# Patient Record
Sex: Male | Born: 1960 | Hispanic: No | Marital: Married | Smoking: Never smoker
Health system: Southern US, Community
[De-identification: ages and names within clinical notes are randomized; demographics above are authoritative.]

## PROBLEM LIST (undated history)

## (undated) DIAGNOSIS — J4530 Mild persistent asthma, uncomplicated: Secondary | ICD-10-CM

## (undated) DIAGNOSIS — N189 Chronic kidney disease, unspecified: Secondary | ICD-10-CM

## (undated) DIAGNOSIS — I10 Essential (primary) hypertension: Secondary | ICD-10-CM

## (undated) DIAGNOSIS — R21 Rash and other nonspecific skin eruption: Secondary | ICD-10-CM

## (undated) DIAGNOSIS — E119 Type 2 diabetes mellitus without complications: Secondary | ICD-10-CM

## (undated) DIAGNOSIS — E782 Mixed hyperlipidemia: Secondary | ICD-10-CM

## (undated) DIAGNOSIS — G4733 Obstructive sleep apnea (adult) (pediatric): Secondary | ICD-10-CM

## (undated) DIAGNOSIS — R7301 Impaired fasting glucose: Secondary | ICD-10-CM

## (undated) DIAGNOSIS — R0683 Snoring: Secondary | ICD-10-CM

## (undated) HISTORY — DX: Mixed hyperlipidemia: E78.2

## (undated) HISTORY — DX: Mild persistent asthma, uncomplicated: J45.30

## (undated) HISTORY — PX: COLONOSCOPY: SHX174

## (undated) HISTORY — DX: Essential (primary) hypertension: I10

## (undated) HISTORY — DX: Obstructive sleep apnea (adult) (pediatric): G47.33

## (undated) HISTORY — DX: Impaired fasting glucose: R73.01

## (undated) HISTORY — DX: Snoring: R06.83

## (undated) HISTORY — DX: Rash and other nonspecific skin eruption: R21

---

## 2002-04-24 ENCOUNTER — Encounter: Payer: Self-pay | Admitting: Emergency Medicine

## 2002-04-24 ENCOUNTER — Emergency Department (HOSPITAL_COMMUNITY): Admission: EM | Admit: 2002-04-24 | Discharge: 2002-04-24 | Payer: Self-pay | Admitting: Emergency Medicine

## 2003-04-18 ENCOUNTER — Emergency Department (HOSPITAL_COMMUNITY): Admission: EM | Admit: 2003-04-18 | Discharge: 2003-04-18 | Payer: Self-pay | Admitting: Emergency Medicine

## 2005-06-26 ENCOUNTER — Emergency Department (HOSPITAL_COMMUNITY): Admission: EM | Admit: 2005-06-26 | Discharge: 2005-06-26 | Payer: Self-pay | Admitting: Emergency Medicine

## 2010-04-26 ENCOUNTER — Emergency Department (HOSPITAL_COMMUNITY)
Admission: EM | Admit: 2010-04-26 | Discharge: 2010-04-26 | Disposition: A | Discharge status: Home / Self Care | Payer: BC Managed Care – PPO | Attending: Emergency Medicine | Admitting: Emergency Medicine

## 2010-04-26 ENCOUNTER — Emergency Department (HOSPITAL_COMMUNITY): Payer: BC Managed Care – PPO

## 2010-04-26 DIAGNOSIS — J45909 Unspecified asthma, uncomplicated: Secondary | ICD-10-CM | POA: Insufficient documentation

## 2010-04-26 DIAGNOSIS — I10 Essential (primary) hypertension: Secondary | ICD-10-CM | POA: Insufficient documentation

## 2010-04-26 DIAGNOSIS — R42 Dizziness and giddiness: Secondary | ICD-10-CM | POA: Insufficient documentation

## 2010-04-26 DIAGNOSIS — S7000XA Contusion of unspecified hip, initial encounter: Secondary | ICD-10-CM | POA: Insufficient documentation

## 2010-04-26 DIAGNOSIS — R296 Repeated falls: Secondary | ICD-10-CM | POA: Insufficient documentation

## 2010-04-26 DIAGNOSIS — R55 Syncope and collapse: Secondary | ICD-10-CM | POA: Insufficient documentation

## 2010-04-26 DIAGNOSIS — Y92009 Unspecified place in unspecified non-institutional (private) residence as the place of occurrence of the external cause: Secondary | ICD-10-CM | POA: Insufficient documentation

## 2010-04-26 DIAGNOSIS — E785 Hyperlipidemia, unspecified: Secondary | ICD-10-CM | POA: Insufficient documentation

## 2010-04-27 LAB — POCT I-STAT, CHEM 8
BUN: 19 mg/dL (ref 6–23)
Calcium, Ion: 1.15 mmol/L (ref 1.12–1.32)
Chloride: 102 mEq/L (ref 96–112)
Creatinine, Ser: 1.4 mg/dL (ref 0.4–1.5)
Sodium: 140 mEq/L (ref 135–145)

## 2010-08-19 ENCOUNTER — Emergency Department (HOSPITAL_COMMUNITY): Payer: BC Managed Care – PPO

## 2010-08-19 ENCOUNTER — Emergency Department (HOSPITAL_COMMUNITY)
Admission: EM | Admit: 2010-08-19 | Discharge: 2010-08-19 | Disposition: A | Discharge status: Home / Self Care | Payer: BC Managed Care – PPO | Attending: Emergency Medicine | Admitting: Emergency Medicine

## 2010-08-19 DIAGNOSIS — R509 Fever, unspecified: Secondary | ICD-10-CM | POA: Insufficient documentation

## 2010-08-19 DIAGNOSIS — Z79899 Other long term (current) drug therapy: Secondary | ICD-10-CM | POA: Insufficient documentation

## 2010-08-19 DIAGNOSIS — R0602 Shortness of breath: Secondary | ICD-10-CM | POA: Insufficient documentation

## 2010-08-19 DIAGNOSIS — E785 Hyperlipidemia, unspecified: Secondary | ICD-10-CM | POA: Insufficient documentation

## 2010-08-19 DIAGNOSIS — E876 Hypokalemia: Secondary | ICD-10-CM | POA: Insufficient documentation

## 2010-08-19 DIAGNOSIS — IMO0001 Reserved for inherently not codable concepts without codable children: Secondary | ICD-10-CM | POA: Insufficient documentation

## 2010-08-19 DIAGNOSIS — I10 Essential (primary) hypertension: Secondary | ICD-10-CM | POA: Insufficient documentation

## 2010-08-19 DIAGNOSIS — R0609 Other forms of dyspnea: Secondary | ICD-10-CM | POA: Insufficient documentation

## 2010-08-19 DIAGNOSIS — J45901 Unspecified asthma with (acute) exacerbation: Secondary | ICD-10-CM | POA: Insufficient documentation

## 2010-08-19 DIAGNOSIS — R0989 Other specified symptoms and signs involving the circulatory and respiratory systems: Secondary | ICD-10-CM | POA: Insufficient documentation

## 2010-08-19 LAB — BASIC METABOLIC PANEL
BUN: 16 mg/dL (ref 6–23)
CO2: 28 mEq/L (ref 19–32)
Chloride: 102 mEq/L (ref 96–112)
Creatinine, Ser: 1.42 mg/dL (ref 0.4–1.5)
Glucose, Bld: 105 mg/dL — ABNORMAL HIGH (ref 70–99)

## 2010-08-19 LAB — CBC
Hemoglobin: 11.2 g/dL — ABNORMAL LOW (ref 13.0–17.0)
MCH: 28.2 pg (ref 26.0–34.0)
MCV: 84.6 fL (ref 78.0–100.0)
RBC: 3.97 MIL/uL — ABNORMAL LOW (ref 4.22–5.81)

## 2010-08-19 LAB — DIFFERENTIAL
Lymphs Abs: 1.5 10*3/uL (ref 0.7–4.0)
Monocytes Absolute: 0.5 10*3/uL (ref 0.1–1.0)
Monocytes Relative: 7 % (ref 3–12)
Neutro Abs: 4.7 10*3/uL (ref 1.7–7.7)
Neutrophils Relative %: 66 % (ref 43–77)

## 2012-09-16 ENCOUNTER — Emergency Department (HOSPITAL_COMMUNITY): Payer: BC Managed Care – PPO

## 2012-09-16 ENCOUNTER — Encounter (HOSPITAL_COMMUNITY): Payer: Self-pay | Admitting: *Deleted

## 2012-09-16 ENCOUNTER — Emergency Department (HOSPITAL_COMMUNITY)
Admission: EM | Admit: 2012-09-16 | Discharge: 2012-09-16 | Disposition: A | Discharge status: Home / Self Care | Payer: BC Managed Care – PPO | Attending: Emergency Medicine | Admitting: Emergency Medicine

## 2012-09-16 DIAGNOSIS — I10 Essential (primary) hypertension: Secondary | ICD-10-CM | POA: Insufficient documentation

## 2012-09-16 DIAGNOSIS — I951 Orthostatic hypotension: Secondary | ICD-10-CM

## 2012-09-16 DIAGNOSIS — Z79899 Other long term (current) drug therapy: Secondary | ICD-10-CM | POA: Insufficient documentation

## 2012-09-16 DIAGNOSIS — R55 Syncope and collapse: Secondary | ICD-10-CM

## 2012-09-16 HISTORY — DX: Essential (primary) hypertension: I10

## 2012-09-16 LAB — POCT I-STAT, CHEM 8
BUN: 16 mg/dL (ref 6–23)
Chloride: 102 mEq/L (ref 96–112)
Creatinine, Ser: 1.5 mg/dL — ABNORMAL HIGH (ref 0.50–1.35)
Glucose, Bld: 101 mg/dL — ABNORMAL HIGH (ref 70–99)
HCT: 38 % — ABNORMAL LOW (ref 39.0–52.0)
Potassium: 3.9 mEq/L (ref 3.5–5.1)

## 2012-09-16 LAB — CBC
HCT: 36.1 % — ABNORMAL LOW (ref 39.0–52.0)
Hemoglobin: 12.4 g/dL — ABNORMAL LOW (ref 13.0–17.0)
MCHC: 34.3 g/dL (ref 30.0–36.0)

## 2012-09-16 NOTE — ED Notes (Addendum)
Pt admits to syncope PTA, felt dizzy upon standing then passed out. Denies fall or injury. Pt alert, NAD, calm, inetractive, skin W&D, resps e/u, speaking in clear complete sentences. Orthostatics done: (see VS). Not dizzy now while lying. Dizzy upon sitting. Dizziness resolved and was not any more dizzy when standing. Not dizzy after orthostatic VS. Wife at [facility]  Mentions 'heartburn", (Denies: pain, nausea, palpitations, sob or other sx). EKG  Reviewed, NSR.

## 2012-09-16 NOTE — ED Provider Notes (Signed)
History    CSN: [id number] Arrival date & time 09/16/12  1735  First MD Initiated Contact with Patient 09/16/12 1937     Chief Complaint  Patient presents with  . Loss of Consciousness   (Consider location/radiation/quality/duration/timing/severity/associated sxs/prior Treatment) Patient is a 52 y.o. male presenting with syncope.  Loss of Consciousness Episode history:  Single Most recent episode:  Today Duration: very brief. Timing:  Constant Progression:  Resolved Chronicity:  New Context: standing up   Witnessed: yes   Relieved by:  Nothing Worsened by:  Nothing tried Ineffective treatments:  None tried Associated symptoms: no anxiety, no chest pain, no dizziness, no fever, no focal weakness, no headaches, no malaise/fatigue, no nausea, no palpitations, no recent fall, no recent surgery, no rectal bleeding, no shortness of breath, no vomiting and no weakness   Risk factors: no coronary artery disease    Past Medical History  Diagnosis Date  . Hypertension    History reviewed. No pertinent past surgical history. History reviewed. No pertinent family history. History  Substance Use Topics  . Smoking status: Not on file  . Smokeless tobacco: Not on file  . Alcohol Use: No    Review of Systems  Constitutional: Negative for fever, chills and malaise/fatigue.  HENT: Negative for congestion, sore throat and rhinorrhea.   Eyes: Negative for photophobia and visual disturbance.  Respiratory: Negative for cough and shortness of breath.   Cardiovascular: Positive for syncope. Negative for chest pain, palpitations and leg swelling.  Gastrointestinal: Negative for nausea, vomiting, abdominal pain, diarrhea and constipation.  Endocrine: Negative for polydipsia and polyuria.  Genitourinary: Negative for dysuria and hematuria.  Musculoskeletal: Negative for back pain and arthralgias.  Skin: Negative for color change and rash.  Neurological: Negative for dizziness, focal  weakness, syncope, weakness, light-headedness and headaches.  Hematological: Negative for adenopathy. Does not bruise/bleed easily.  All other systems reviewed and are negative.    Allergies  Review of patient's allergies indicates no known allergies.  Home Medications   Current Outpatient Rx  Name  Route  Sig  Dispense  Refill  . albuterol (PROVENTIL HFA;VENTOLIN HFA) 108 (90 BASE) MCG/ACT inhaler   Inhalation   Inhale 2 puffs into the lungs every 6 (six) hours as needed for wheezing or shortness of breath.         . allopurinol (ZYLOPRIM) 100 MG tablet   Oral   Take 100 mg by mouth every other day.         Marland Kitchen atorvastatin (LIPITOR) 10 MG tablet   Oral   Take 10 mg by mouth every morning.         . Azilsartan Medoxomil (EDARBI) 40 MG TABS   Oral   Take 20 mg by mouth daily.         . verapamil (CALAN-SR) 120 MG CR tablet   Oral   Take 120 mg by mouth every morning.          BP 113/73  Pulse 70  Resp 21  SpO2 99% Physical Exam  Vitals reviewed. Constitutional: He is oriented to person, place, and time. He appears well-developed and well-nourished.  HENT:  Head: Normocephalic and atraumatic.  Eyes: Conjunctivae and EOM are normal.  Neck: Normal range of motion. Neck supple.  Cardiovascular: Normal rate, regular rhythm and normal heart sounds.   Pulmonary/Chest: Effort normal and breath sounds normal. No respiratory distress.  Abdominal: He exhibits no distension. There is no tenderness. There is no rebound and no guarding.  Musculoskeletal:  Normal range of motion.  Neurological: He is alert and oriented to person, place, and time. He has normal strength and normal reflexes. No cranial nerve deficit or sensory deficit. Gait normal.  Skin: Skin is warm and dry.    ED Course  Procedures (including critical care time) Labs Reviewed  GLUCOSE, CAPILLARY - Abnormal; Notable for the following:    Glucose-Capillary 123 (*)    All other components within normal  limits  CBC - Abnormal; Notable for the following:    Hemoglobin 12.4 (*)    HCT 36.1 (*)    All other components within normal limits  POCT I-STAT, CHEM 8 - Abnormal; Notable for the following:    Creatinine, Ser 1.50 (*)    Glucose, Bld 101 (*)    Hemoglobin 12.9 (*)    HCT 38.0 (*)    All other components within normal limits   Dg Chest 2 View  09/16/2012   *RADIOLOGY REPORT*  Clinical Data: Loss of conscious  CHEST - 2 VIEW  Comparison: Prior radiograph from 08/19/2010  Findings: The cardiac and mediastinal silhouettes are stable in size and contour, and remain within normal limits.  The lungs are clear.  No acute osseous abnormality is identified.  IMPRESSION: No acute cardiopulmonary findings.   Original Report Authenticated By: Rise Mu, M.D.   1. Syncope   2. Orthostatic syncope     Date: 09/17/2012  Rate: 69  Rhythm: normal sinus rhythm  QRS Axis: normal  Intervals: normal  ST/T Wave abnormalities: normal  Conduction Disutrbances: L anterior fasicular block  Narrative Interpretation: NSR without acute ischemic changes  Old EKG Reviewed: No significant changes noted     MDM   52 y.o. male  with pertinent PMH of HTN presents with likely orthostatic syncope per history.  Pt on multiple antiHTN medications, however has recently improved diet and exercise, and over last three days has had intermittent lightheadedness on standing.  Pt went to stand today and had lightheadedness progressing to immediate and brief syncopal episode.  No chest pain, dyspnea, focal weakness, or other symptoms, and pt returned to baseline almost immediately.  Physical exam today unremarkable, and pt asymptomatic.  ECG, cxr, adn labwork unremarkable, troponin negative.  No concerning cardiac history of family history of same to indicate need for admission, clear orthostatic history.  Given strict return precautions for changing or worsening symptoms, voices understanding, and agrees to fu.    Labs and imaging as above reviewed by myself and attending,Dr. Anitra Lauth, with whom case was discussed.   1. Syncope   2. Orthostatic syncope       Noel Gerold, MD 09/17/12 2130

## 2012-09-16 NOTE — ED Notes (Signed)
Returned from CXR, c/o wheezing, scant wheezing heard audible, upper airway noises, LS CTA bilaterally, pt states, "I would use my inhaler if I had it". VSS.

## 2012-09-16 NOTE — ED Notes (Signed)
Not in room

## 2012-09-16 NOTE — ED Notes (Signed)
Pt reports feeling fine today. Was lying down and then got up to turn the fan on and became dizzy and had syncopal episode. No acute distress noted at triage, ekg done.

## 2012-09-17 NOTE — ED Provider Notes (Signed)
I saw and evaluated the patient, reviewed the resident's note and I agree with the findings and plan. I have reviewed EKG and agree with the resident interpretation.  you Pt with syncope which is suggestive of orthostatic hypotension most likely from having too much BP meds.  Pt has changed diet and started exercising and loosing weight and still taking BP meds and has been noticing BP is dropping.  Labs wnl.  EKG wnl. Pt d/ced home and will hold bp meds till speaking with PCP tomorrow.   Gwyneth Sprout, MD 09/17/12 2117

## 2014-12-08 ENCOUNTER — Institutional Professional Consult (permissible substitution): Payer: BLUE CROSS/BLUE SHIELD | Admitting: Neurology

## 2014-12-31 ENCOUNTER — Ambulatory Visit (INDEPENDENT_AMBULATORY_CARE_PROVIDER_SITE_OTHER): Payer: BLUE CROSS/BLUE SHIELD | Admitting: Neurology

## 2014-12-31 ENCOUNTER — Encounter: Payer: Self-pay | Admitting: Neurology

## 2014-12-31 VITALS — BP 128/90 | HR 78 | Resp 20 | Ht 71.0 in | Wt 235.0 lb

## 2014-12-31 DIAGNOSIS — R0683 Snoring: Secondary | ICD-10-CM

## 2014-12-31 DIAGNOSIS — G4730 Sleep apnea, unspecified: Secondary | ICD-10-CM

## 2014-12-31 DIAGNOSIS — E669 Obesity, unspecified: Secondary | ICD-10-CM | POA: Diagnosis not present

## 2014-12-31 DIAGNOSIS — G4710 Hypersomnia, unspecified: Secondary | ICD-10-CM | POA: Insufficient documentation

## 2014-12-31 HISTORY — DX: Snoring: R06.83

## 2014-12-31 NOTE — Progress Notes (Signed)
SLEEP MEDICINE CLINIC   Provider:  Melvyn Novas, M D  Referring Provider: Georgianne Fick, MD Primary Care Physician:  Georgianne Fick, MD  Chief Complaint  Patient presents with  . New Patient (Initial Visit)    snoring, sleep consult, rm 10, alone    HPI:  Nathaniel Robertson is a 54 y.o. male , seen here as a referral  from Dr. Nicholos Johns for a sleep consultation,  Nathaniel Robertson and works as a travel Water quality scientist, is married and has a Psychologist, occupational son. He used to live in [place].  He presents today because his wife has made him repeatedly aware that he snores loudly. He also suffers from some excessive daytime sleepiness and fatigue at times affecting his ability to concentrate. He endorsed also that he has sometimes with breathing difficulties with wheezing and he mentioned to me that on occasion he has trouble finding the right words or concentrating on the words he is speaking. This kind of memory loss seems to be also related to fatigability. He had also mentioned slurred speech. He also reports numbness on his left hand only.  There are witnessed apneas, reported to him by his wife and guests during a church retreat. He shared a cabin with 3 other men.  The patient works as a travel Chief Executive Officer on call.   Sleep habits are as follows: the bedroom is cool, quiet and dark. He shares it with his wife.  The patient reports that he prefers to sleep on his side usually goes to bed around between 9 and 10 PM and usually falls asleep promptly. He has a sleep monitor through his fit [employer]. It seems that he usually is asleep before midnight but he also seems to frequently throughout the night. He reports that he seems to wake up for short periods of time look at the clock and goes back to sleep usually. Most nights he will have one bathroom break interrupting his sleep. Even when he wakes up, he finds himself in lateral recumbent sleep position.  He has noted that he wakes up from feeling that his  airway collapsed. He has not noticed to wake up from dreaming and he does not report vivid or disturbing dreams. Also he has an alarm clock set for 6:30 he wakes already between 5 and 5:30 spontaneously prior to its ringing. Initially he feels restored and refreshed that after lunch time he begins to become fatigued and excessively daytime sleepy. He usually has one coffee in the morning with breakfast, during the day he does not drink anymore caffeine containing beverages- he only drinks water. He does not report taking regular naps but he gets sleepy when he is driving. He is aware of the sleepiness towards the end of the day .  Sleep medical history and family sleep history:  Parents may have been snorer's,  But were not diagnosed with a sleep disorder. Social history:  Rare ETOH, non smoker, low caffeine intake.  Works for a mission. He used to walk  but has gotten out of the habit.   Review of Systems: Out of a complete 14 system review, the patient complains of only the following symptoms, and all other reviewed systems are negative. He endorsed the Epworth sleepiness score at 10 points fatigue severity score at 32 points. Some word finding difficulties,  2 syncopes 4 years ago, cardiac work up negative.  .     Social History   Social History  . Marital Status: Married    Spouse  Name: [name]  . Number of Children: N/A  . Years of Education: N/A   Occupational History  . travel agent    Social History Main Topics  . Smoking status: Never Smoker   . Smokeless tobacco: Not on file  . Alcohol Use: 0.0 oz/week    0 Standard drinks or equivalent per week     Comment: occasional  . Drug Use: No  . Sexual Activity: Not on file   Other Topics Concern  . Not on file   Social History Narrative   Drinks 1 cup of coffee in the morning.      Married with 3 children.    Family History  Problem Relation Age of Onset  . Diabetes Mother     Past Medical History  Diagnosis Date    . Hypertension   . Essential hypertension   . Mixed hyperlipidemia   . Obstructive sleep apnea   . Rash   . Impaired fasting glucose   . Mild persistent asthma without complication     No past surgical history on file.  Current Outpatient Prescriptions  Medication Sig Dispense Refill  . albuterol (PROVENTIL HFA;VENTOLIN HFA) 108 (90 BASE) MCG/ACT inhaler Inhale 2 puffs into the lungs every 6 (six) hours as needed for wheezing or shortness of breath.    Marland Kitchen atorvastatin (LIPITOR) 10 MG tablet Take 10 mg by mouth every morning.    . Budesonide-Formoterol Fumarate (SYMBICORT IN) Inhale 108 mcg into the lungs.    Marland Kitchen losartan (COZAAR) 25 MG tablet Take 50 mg by mouth daily.     . Azilsartan Medoxomil (EDARBI) 40 MG TABS Take 20 mg by mouth daily.    . montelukast (SINGULAIR) 10 MG tablet Take 10 mg by mouth at bedtime.    . verapamil (CALAN-SR) 120 MG CR tablet Take 120 mg by mouth every morning.     No current facility-administered medications for this visit.    Allergies as of 12/31/2014  . (No Known Allergies)    Vitals: BP 128/90 mmHg  Pulse 78  Resp 20  Ht  (1.803 m)  Wt 235 lb (106.595 kg)  BMI 32.79 kg/m2 Last Weight:  Wt Readings from Last 1 Encounters:  12/31/14 235 lb (106.595 kg)   ZOX:WRUE mass index is 32.79 kg/(m^2).     Last Height:   Ht Readings from Last 1 Encounters:  12/31/14  (1.803 m)    Physical exam:  General: The patient is awake, alert and appears not in acute distress. The patient is well groomed. Head: Normocephalic, atraumatic. Neck is supple. Mallampati 1,  neck circumference:17.25 . Nasal airflow unrestricted, TMJ not  evident . Retrognathia is not seen. He had his wisdom teeth removed.  Cardiovascular:  Regular rate and rhythm , without  murmurs or carotid bruit, and without distended neck veins. Respiratory: Lungs are clear to auscultation. Skin:  Without evidence of edema, or rash Trunk: BMI is elevated . The patient's posture  is erect .   Neurologic exam : The patient is awake and alert, oriented to place and time.   Memory subjective described as intact.    Attention span & concentration ability appears normal.  Speech is fluent,  without dysarthria, dysphonia or aphasia.  Mood and affect are appropriate.  Cranial nerves: Pupils are equal and briskly reactive to light. Funduscopic exam without  evidence of pallor or edema. Extraocular movements  in vertical and horizontal planes intact and without nystagmus. Visual fields by finger perimetry are intact. Hearing  to finger rub intact.   Facial sensation intact to fine touch.  Facial motor strength is symmetric and tongue and uvula move midline. Shoulder shrug was symmetrical.   Motor exam:  Normal tone, muscle bulk and symmetric strength in all extremities. Grip strength is bilaterally decreased and at the patient tries to provide the best possible strength some tremor is noted.  Sensory:  Fine touch, pinprick and vibration were tested in all extremities. Proprioception tested in the upper extremities was normal.  Coordination: Rapid alternating movements in the fingers/hands was normal. Finger-to-nose maneuver  normal without evidence of ataxia, dysmetria or tremor.  Gait and station: Patient walks without assistive device and is able unassisted to climb up to the exam table. Strength within normal limits.  Stance is stable and normal.  Toe and hell stand were tested .Tandem gait is unfragmented. Turns with 3 Steps. Romberg testing is negative.  Deep tendon reflexes: in the  upper and lower extremities are symmetric and intact. Babinski maneuver response is downgoing.  The patient was advised of the nature of the diagnosed sleep disorder , the treatment options and risks for general a health and wellness arising from not treating the condition.  I spent more than 40 minutes of face to face time with the patient. Greater than 50% of time was spent in counseling  and coordination of care. We have discussed the diagnosis and differential and I answered the patient's questions.     Assessment:  After physical and neurologic examination, review of laboratory studies,  Personal review of imaging studies, reports of other /same  Imaging studies ,  Results of polysomnography/ neurophysiology testing and pre-existing records as far as provided in visit., my assessment is   1) Nathaniel Robertson has been witnessed to snore and have apneas. For this reason I feel it is very likely that he suffers from obstructive sleep apnea. He does not have any medical history or medications listed that would put him to what central apneas. He has an elevated body mass index and a larger neck circumference but he does not have excessive soft tissue of the upper airway. The patient inquired about possible surgeries and a UPPP would not be indicated in a patient with a wide open Mallampati grade 2.  2) Nathaniel Robertson  risk factor is mainly weight at this time. He has lost some weight but he started exercising in form of walking. I would strongly encouraged him to continue that and I like that he hydrates with water and avoids sugary drinks. He may further look at his diet to see if he can reduce carbohydrates.  3) the daytime sleepiness was rated at an Epworth 10 which is borderline the fatigue severity score at 32. The patient becomes more fatigable as the day goes on towards the afternoon. He also has irregular work hours and is in more than one way on call. 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 I would like for him to try melatonin at 3 mg at night to be taken half an hour before intended bedtime as this may help him to sleep a little longer through the night. I will order a split night polysomnography to see if obstructive sleep apnea is present and what degree. If this apnea is not associated with oxygen desaturations or REM sleep accentuation, he may be a candidate for simple  dental device, rather than surgery or CPAP appliance.    Plan:  Treatment plan and additional workup :  I would like for him to try melatonin at 3 mg at night to be taken half an hour before intended bedtime as this may help him to sleep a little longer through the night. I will order a split night polysomnography to see if obstructive sleep apnea is present and what degree. If this apnea is not associated with oxygen desaturations or REM sleep accentuation, he may be a candidate for simple dental device, rather than surgery or CPAP appliance.    Porfirio Mylararmen Mikeala Girdler MD  12/31/2014   CC: Georgianne FickAjith Ramachandran, Md [address] [place], [place] [id number]

## 2015-02-01 ENCOUNTER — Ambulatory Visit (INDEPENDENT_AMBULATORY_CARE_PROVIDER_SITE_OTHER): Payer: BLUE CROSS/BLUE SHIELD | Admitting: Neurology

## 2015-02-01 DIAGNOSIS — E669 Obesity, unspecified: Secondary | ICD-10-CM

## 2015-02-01 DIAGNOSIS — G4730 Sleep apnea, unspecified: Secondary | ICD-10-CM

## 2015-02-01 DIAGNOSIS — G4733 Obstructive sleep apnea (adult) (pediatric): Secondary | ICD-10-CM | POA: Diagnosis not present

## 2015-02-01 DIAGNOSIS — R0683 Snoring: Secondary | ICD-10-CM

## 2015-02-01 DIAGNOSIS — G4710 Hypersomnia, unspecified: Secondary | ICD-10-CM

## 2015-02-02 NOTE — Sleep Study (Signed)
Please see the scanned sleep study interpretation located in the Procedure tab within the Chart Review section. 

## 2015-02-10 ENCOUNTER — Telehealth: Payer: Self-pay

## 2015-02-10 DIAGNOSIS — G4733 Obstructive sleep apnea (adult) (pediatric): Secondary | ICD-10-CM

## 2015-02-10 NOTE — Telephone Encounter (Signed)
Spoke to pt regarding sleep study results. I advised pt that his sleep study revealed sleep apnea. Dr. Vickey Hugerohmeier advised starting him on an auto cpap. Pt is willing to proceed with cpap. He is concerned about the cost, and I informed him that I would ask the medical equipment company that I send his information to to give him a call regarding cost. A f/u appt was made for 04/15/15 at 9:30. Pt verbalized understanding. Will send order to [facility].

## 2015-04-15 ENCOUNTER — Ambulatory Visit: Payer: Self-pay | Admitting: Neurology

## 2015-06-23 ENCOUNTER — Encounter: Payer: Self-pay | Admitting: Allergy and Immunology

## 2015-06-23 ENCOUNTER — Ambulatory Visit (INDEPENDENT_AMBULATORY_CARE_PROVIDER_SITE_OTHER): Payer: BLUE CROSS/BLUE SHIELD | Admitting: Allergy and Immunology

## 2015-06-23 VITALS — BP 144/102 | HR 72 | Temp 98.0°F | Resp 18 | Ht 68.7 in | Wt 238.1 lb

## 2015-06-23 DIAGNOSIS — J309 Allergic rhinitis, unspecified: Secondary | ICD-10-CM | POA: Diagnosis not present

## 2015-06-23 DIAGNOSIS — L299 Pruritus, unspecified: Secondary | ICD-10-CM

## 2015-06-23 DIAGNOSIS — L503 Dermatographic urticaria: Secondary | ICD-10-CM | POA: Diagnosis not present

## 2015-06-23 DIAGNOSIS — H1010 Acute atopic conjunctivitis, unspecified eye: Secondary | ICD-10-CM | POA: Diagnosis not present

## 2015-06-23 DIAGNOSIS — J4540 Moderate persistent asthma, uncomplicated: Secondary | ICD-10-CM

## 2015-06-23 DIAGNOSIS — T781XXA Other adverse food reactions, not elsewhere classified, initial encounter: Secondary | ICD-10-CM | POA: Diagnosis not present

## 2015-06-23 MED ORDER — EPINEPHRINE 0.3 MG/0.3ML IJ SOAJ: INTRAMUSCULAR | Status: DC

## 2015-06-23 NOTE — Progress Notes (Signed)
Dear Dr. Nicholos Johnsamachandran,  Thank you for referring Nathaniel Hemanonnose Bily to the [facility] on 06/23/2015.   Below is a summation of this patient's evaluation and recommendations.  Thank you for your referral. I will keep you informed about this patient's response to treatment.   If you have any questions please to do hestitate to contact me.   Sincerely,  Jessica PriestEric J. Valarie Farace, MD [facility]   ______________________________________________________________________    NEW PATIENT NOTE  Referring Provider: Georgianne Robertson, Ajith, MD Primary Provider: Georgianne Robertson,AJITH, MD Date of office visit: 06/23/2015    Subjective:   Chief Complaint:  Nathaniel Robertson (DOB: June 23, 1960) is a 55 y.o. male with a chief complaint of Pruritus  who presents to the clinic on 06/23/2015 with the following problems:  HPI Comments: Nathaniel Robertson presents to this clinic in evaluation of possible allergies giving rise to itchiness. He has a one-year history of being itchy on his back and he's had progression of his itchiness to involve most parts of his body over the course of the past 2 months. He does not know really notice a obvious dermatitis unless he scratches his skin and then he develops hives. He is seen a dermatologist who give him an ointment which did not help at all. There is no associated systemic or constitutional symptoms and there is no obvious trigger. He has been eating eggs every morning for the past 6 months which is a new environmental change. He does have associated atopic disease. He has allergic rhinoconjunctivitis with eye itchiness and sneezing especially during the spring which she takes loratadine which helps. He underwent a course of immunotherapy when living in [place] prior to 2003. He does have issues with asthma which is presently well controlled with the use of Symbicort on a daily basis. He rarely uses a  short acting bronchodilator at this point in time and can exercise by difficulty. His asthma is a little bit more active during the spring than the rest of the year. He will get itchy mouth and itchy throat if he eats apples. So far he has not developed any associated systemic or constitutional symptoms with this reaction. Apparently he had blood tests performed about 2 months ago for a standard physical and he thinks that all the results have been normal.   Past Medical History  Diagnosis Date  . Hypertension   . Essential hypertension   . Mixed hyperlipidemia   . Obstructive sleep apnea   . Rash   . Impaired fasting glucose   . Mild persistent asthma without complication   . Snoring 12/31/2014    History reviewed. No pertinent past surgical history.    Medication List           albuterol 108 (90 Base) MCG/ACT inhaler  Commonly known as:  PROVENTIL HFA;VENTOLIN HFA  Inhale 2 puffs into the lungs every 6 (six) hours as needed for wheezing or shortness of breath.     atorvastatin 10 MG tablet  Commonly known as:  LIPITOR  Take 10 mg by mouth every morning.     losartan 100 MG tablet  Commonly known as:  COZAAR  Take 100 mg by mouth daily.     metFORMIN 500 MG 24 hr tablet  Commonly known as:  GLUCOPHAGE-XR  as needed.     montelukast 10 MG tablet  Commonly known as:  SINGULAIR  Take 10 mg by mouth at bedtime. Reported  on 06/23/2015     SYMBICORT IN  Inhale 108 mcg into the lungs.     triamcinolone cream 0.1 %  Commonly known as:  KENALOG        No Known Allergies  Review of systems negative except as noted in HPI / PMHx or noted below:  Review of Systems  Constitutional: Negative.   HENT: Negative.   Eyes: Negative.   Respiratory: Negative.   Cardiovascular: Negative.   Gastrointestinal: Negative.   Genitourinary: Negative.   Musculoskeletal: Negative.   Skin: Negative.   Neurological: Negative.   Endo/Heme/Allergies: Negative.   Psychiatric/Behavioral:  Negative.     Family History  Problem Relation Age of Onset  . Diabetes Mother   . Asthma Father     Social History   Social History  . Marital Status: Married    Spouse Name: Nathaniel Robertson  . Number of Children: N/A  . Years of Education: N/A   Occupational History  . travel agent    Social History Main Topics  . Smoking status: Never Smoker   . Smokeless tobacco: Never Used  . Alcohol Use: No     Comment: occasional  . Drug Use: No  . Sexual Activity: Not on file   Other Topics Concern  . Not on file   Social History Narrative   Drinks 1 cup of coffee in the morning.      Married with 3 children.    Environmental and Social history  Lives in a house with a dry environment, a dog located inside the household, no carpeting in the bedroom, no plastic on the bed or pillow, and no smoking ongoing with inside the household. He is a travel agent   Objective:   Filed Vitals:   06/23/15 0930  BP: 144/102  Pulse: 72  Temp: 98 F (36.7 C)  Resp: 18   Height: 5' 8.7" (174.5 cm) Weight: 238 lb 1.6 oz (108 kg)  Physical Exam  Constitutional: He is well-developed, well-nourished, and in no distress.  HENT:  Head: Normocephalic. Head is without right periorbital erythema and without left periorbital erythema.  Right Ear: Tympanic membrane, external ear and ear canal normal.  Left Ear: Tympanic membrane, external ear and ear canal normal.  Nose: Nose normal. No mucosal edema or rhinorrhea.  Mouth/Throat: Oropharynx is clear and moist and mucous membranes are normal. No oropharyngeal exudate.  Eyes: Conjunctivae and lids are normal. Pupils are equal, round, and reactive to light.  Neck: Trachea normal. No tracheal deviation present. No thyromegaly present.  Cardiovascular: Normal rate, regular rhythm, S1 normal, S2 normal and normal heart sounds.   No murmur heard. Pulmonary/Chest: Effort normal. No stridor. No tachypnea. No respiratory distress. He has no wheezes. He has  no rales. He exhibits no tenderness.  Abdominal: Soft. He exhibits no distension and no mass. There is no hepatosplenomegaly. There is no tenderness. There is no rebound and no guarding.  Musculoskeletal: He exhibits no edema or tenderness.  Lymphadenopathy:       Head (right side): No tonsillar adenopathy present.       Head (left side): No tonsillar adenopathy present.    He has no cervical adenopathy.    He has no axillary adenopathy.  Neurological: He is alert. Gait normal.  Skin: Rash (Dermatographia. Multiple excoriated and unroofed hyperpigmented nodules chest and extremities) noted. He is not diaphoretic. No erythema. No pallor. Nails show no clubbing.  Psychiatric: Mood and affect normal.     Diagnostics: Allergy skin tests were  performed. He demonstrated hypersensitivity to grasses, weeds, trees, and house dust mite, cat, and cockroach. He did not demonstrate any hypersensitivity to foods.  Spirometry was performed and demonstrated an FEV1 of 1.81 @ 50 % of predicted.  The patient had an Asthma Control Test with the following results:  .     Assessment and Plan:    1. Asthma, moderate persistent, well-controlled   2. Allergic rhinoconjunctivitis   3. Oral allergy syndrome, initial encounter   4. Pruritic condition   5. Dermatographia     1. Allergen avoidance measures  2. Review previous blood tests - more testing?  3. Treat and prevent inflammation:   A. montelukast 10 mg one tablet one time per day  B. OTC Rhinocort one spray each nostril 3-7 times a week. Coupon.  C. continue Symbicort 162 inhalations twice a day  4. If needed:   A. cetirizine 10 mg one tablet one-2 times a day  B. Proventil HFA 2 puffs every 4-6 hours  C. EpiPen, as Benadryl, M.D./ER for allergic reaction  5. Can add in Benadryl as needed  6. Further evaluation?  7. Return to clinic in 4 weeks or earlier if problem  Nathaniel Robertson will use a combination of therapy in an attempt to calm down  his immunologic hyperreactivity manifested as atopic respiratory disease and a pruritic condition and oral allergy syndrome. I will review his previous blood tests and we'll make a decision about further blood testing once I can get those previous tests reviewed. He is at some risk for a systemic reaction given his history of oral allergy syndrome and thus I given him an EpiPen. We will increase his dose of antihistamine and I'll regroup with him in approximately 4 weeks or earlier if there is a problem. At that point we'll make decision about further evaluation and treatment.  Jessica Priest, MD [facility]

## 2015-06-23 NOTE — Patient Instructions (Addendum)
  1. Allergen avoidance measures  2. Review previous blood tests - more testing?  3. Treat and prevent inflammation:   A. montelukast 10 mg one tablet one time per day  B. OTC Rhinocort one spray each nostril 3-7 times a week. Coupon.  C. continue Symbicort 160 - 2 inhalations twice a day  4. If needed:   A. cetirizine 10 mg one tablet one-2 times a day  B. Ventolin HFA 2 puffs every 4-6 hours  C. EpiPen, Benadryl, M.D./ER for allergic reaction  5. Can add in Benadryl as needed  6. Further evaluation?  7. Return to clinic in 4 weeks or earlier if problem

## 2015-07-21 ENCOUNTER — Ambulatory Visit (INDEPENDENT_AMBULATORY_CARE_PROVIDER_SITE_OTHER): Payer: BLUE CROSS/BLUE SHIELD | Admitting: Allergy and Immunology

## 2015-07-21 ENCOUNTER — Encounter: Payer: Self-pay | Admitting: Allergy and Immunology

## 2015-07-21 VITALS — BP 128/92 | HR 80 | Resp 16

## 2015-07-21 DIAGNOSIS — L299 Pruritus, unspecified: Secondary | ICD-10-CM

## 2015-07-21 DIAGNOSIS — J309 Allergic rhinitis, unspecified: Secondary | ICD-10-CM

## 2015-07-21 DIAGNOSIS — J4540 Moderate persistent asthma, uncomplicated: Secondary | ICD-10-CM

## 2015-07-21 DIAGNOSIS — H1010 Acute atopic conjunctivitis, unspecified eye: Secondary | ICD-10-CM | POA: Diagnosis not present

## 2015-07-21 DIAGNOSIS — T781XXA Other adverse food reactions, not elsewhere classified, initial encounter: Secondary | ICD-10-CM | POA: Diagnosis not present

## 2015-07-21 MED ORDER — BECLOMETHASONE DIPROPIONATE 80 MCG/ACT IN AERS: 2.0000 | INHALATION_SPRAY | Freq: Two times a day (BID) | RESPIRATORY_TRACT | Status: DC

## 2015-07-21 NOTE — Progress Notes (Signed)
Follow-up Note  Referring Provider: Georgianne Fickamachandran, Ajith, MD Primary Provider: Georgianne FickAMACHANDRAN,AJITH, MD Date of Office Visit: 07/21/2015  Subjective:   Nathaniel Robertson (DOB: November 26, 1960) is a 55 y.o. male who [facility]2015 in re-evaluation of the following:  HPI: Nathaniel Robertson to this clinic in reevaluation of his multiorgan atopic disease.  His skin is doing better. He is not as itchy. He's not been scratching his skin. He uses a Vaseline usually one time per day but does not use any prescription medication topically.  His nose is doing quite well. His eyes are doing well. He is continued to use medical therapy prescribed during his last visit. He is performed house dust avoidance measures.  His asthma was doing quite well until about one week ago. At that point in time he developed some cough along with a sore throat. It should be noted that he contracted this issue when in [place]. He has not had use a short acting bronchodilator. Overall he thinks his asthma was definitely doing a lot better until this event. He has not had any fever or ugly nasal discharge or ugly sputum production with this event.    Medication List           albuterol 108 (90 Base) MCG/ACT inhaler  Commonly known as:  PROVENTIL HFA;VENTOLIN HFA  Inhale 2 puffs into the lungs every 6 (six) hours as needed for wheezing or shortness of breath.     atorvastatin 10 MG tablet  Commonly known as:  LIPITOR  Take 10 mg by mouth every morning.     cetirizine 10 MG tablet  Commonly known as:  ZYRTEC  Take 10 mg by mouth daily.     EPINEPHrine 0.3 mg/0.3 mL Soaj injection  Commonly known as:  EPI-PEN  Use as directed for life-threatening allergic reaction.     losartan 100 MG tablet  Commonly known as:  COZAAR  Take 100 mg by mouth daily.     metFORMIN 500 MG 24 hr tablet  Commonly known as:  GLUCOPHAGE-XR  as needed.     montelukast 10 MG tablet  Commonly known  as:  SINGULAIR  Take 10 mg by mouth at bedtime. Reported on 06/23/2015     RHINOCORT ALLERGY 32 MCG/ACT nasal spray  Generic drug:  budesonide  Place 1 spray into both nostrils daily.     SYMBICORT IN  Inhale 108 mcg into the lungs 2 (two) times daily.     triamcinolone cream 0.1 %  Commonly known as:  KENALOG        Past Medical History  Diagnosis Date  . Hypertension   . Essential hypertension   . Mixed hyperlipidemia   . Obstructive sleep apnea   . Rash   . Impaired fasting glucose   . Mild persistent asthma without complication   . Snoring 12/31/2014    History reviewed. No pertinent past surgical history.  No Known Allergies  Review of systems negative except as noted in HPI / PMHx or noted below:  Review of Systems  Constitutional: Negative.   HENT: Negative.   Eyes: Negative.   Respiratory: Negative.   Cardiovascular: Negative.   Gastrointestinal: Negative.   Genitourinary: Negative.   Musculoskeletal: Negative.   Skin: Negative.   Neurological: Negative.   Endo/Heme/Allergies: Negative.   Psychiatric/Behavioral: Negative.      Objective:   Filed Vitals:   07/21/15 0957  BP: 128/92  Pulse: 80  Resp: 16  Physical Exam  Constitutional: He is well-developed, well-nourished, and in no distress.  HENT:  Head: Normocephalic.  Right Ear: Tympanic membrane, external ear and ear canal normal.  Left Ear: Tympanic membrane, external ear and ear canal normal.  Nose: Nose normal. No mucosal edema or rhinorrhea.  Mouth/Throat: Uvula is midline, oropharynx is clear and moist and mucous membranes are normal. No oropharyngeal exudate.  Eyes: Conjunctivae are normal.  Neck: Trachea normal. No tracheal tenderness present. No tracheal deviation present. No thyromegaly present.  Cardiovascular: Normal rate, regular rhythm, S1 normal, S2 normal and normal heart sounds.   No murmur heard. Pulmonary/Chest: Breath sounds normal. No stridor. No respiratory  distress. He has no wheezes. He has no rales.  Musculoskeletal: He exhibits no edema.  Lymphadenopathy:       Head (right side): No tonsillar adenopathy present.       Head (left side): No tonsillar adenopathy present.    He has no cervical adenopathy.  Neurological: He is alert. Gait normal.  Skin: Rash (Hyperpigmented and occasional unroofed nodules and papules involving chest and extremities. No obvious erythema.) noted. He is not diaphoretic. No erythema. Nails show no clubbing.  Psychiatric: Mood and affect normal.    Diagnostics:    Spirometry was performed and demonstrated an FEV1 of 2.20 at 65 % of predicted.  The patient had an Asthma Control Test with the following results:  .    Assessment and Plan:   1. Asthma, moderate persistent, well-controlled   2. Allergic rhinoconjunctivitis   3. Oral allergy syndrome, initial encounter   4. Pruritic condition     1. Continue to perform Allergen avoidance measures  2. "Action plan" for asthma flare up:   A. add Qvar 80 - 2 inhalations twice a day to Symbicort  B. use Ventolin HFA if needed  3. Continue to Treat and prevent inflammation:   A. montelukast 10 mg one tablet one time per day  B. OTC Rhinocort one spray each nostril 3-7 times a week.   C. Symbicort 160 - 2 inhalations twice a day  4. If needed:   A. cetirizine 10 mg one tablet one-2 times a day  B. Ventolin HFA 2 puffs every 4-6 hours  C. EpiPen, Benadryl, M.D./ER for allergic reaction  D. ointment-based topical treatment especially after shower or bath  5. Can add in Benadryl as needed  6. Return to clinic in 12 weeks or earlier if problem  Overall, [name] appears to be doing relatively well on his current medical plan and the only manipulation I made today was to create a action plan that he can initiate at points in time when he does develop any asthma flare. We will keep him on the therapy mentioned above for the next 12 weeks and regroup with him at  that point in time to make a decision about further evaluation and treatment pending his response.  [name], MD [facility]

## 2015-07-21 NOTE — Patient Instructions (Addendum)
  1. Continue to perform Allergen avoidance measures  2. "Action plan" for asthma flare up:   A. add Qvar 80 - 2 inhalations twice a day to Symbicort  B. use Ventolin HFA if needed  3. Continue to Treat and prevent inflammation:   A. montelukast 10 mg one tablet one time per day  B. OTC Rhinocort one spray each nostril 3-7 times a week. Coupon.  C. Symbicort 160 - 2 inhalations twice a day  4. If needed:   A. cetirizine 10 mg one tablet one-2 times a day  B. Ventolin HFA 2 puffs every 4-6 hours  C. EpiPen, Benadryl, M.D./ER for allergic reaction  D. ointment-based topical treatment especially after shower or bath  5. Can add in Benadryl as needed  6. Return to clinic in 12 weeks or earlier if problem

## 2015-07-27 ENCOUNTER — Encounter: Payer: Self-pay | Admitting: *Deleted

## 2015-10-13 ENCOUNTER — Encounter: Payer: Self-pay | Admitting: Allergy and Immunology

## 2015-10-13 ENCOUNTER — Ambulatory Visit (INDEPENDENT_AMBULATORY_CARE_PROVIDER_SITE_OTHER): Payer: BLUE CROSS/BLUE SHIELD | Admitting: Allergy and Immunology

## 2015-10-13 VITALS — BP 138/74 | HR 78 | Temp 98.8°F | Resp 16

## 2015-10-13 DIAGNOSIS — H1010 Acute atopic conjunctivitis, unspecified eye: Secondary | ICD-10-CM

## 2015-10-13 DIAGNOSIS — J309 Allergic rhinitis, unspecified: Secondary | ICD-10-CM

## 2015-10-13 DIAGNOSIS — L299 Pruritus, unspecified: Secondary | ICD-10-CM | POA: Diagnosis not present

## 2015-10-13 DIAGNOSIS — J4540 Moderate persistent asthma, uncomplicated: Secondary | ICD-10-CM

## 2015-10-13 DIAGNOSIS — K219 Gastro-esophageal reflux disease without esophagitis: Secondary | ICD-10-CM

## 2015-10-13 DIAGNOSIS — T781XXA Other adverse food reactions, not elsewhere classified, initial encounter: Secondary | ICD-10-CM | POA: Diagnosis not present

## 2015-10-13 DIAGNOSIS — J387 Other diseases of larynx: Secondary | ICD-10-CM | POA: Diagnosis not present

## 2015-10-13 MED ORDER — OMEPRAZOLE 40 MG PO CPDR: DELAYED_RELEASE_CAPSULE | ORAL | 5 refills | Status: DC

## 2015-10-13 MED ORDER — RANITIDINE HCL 300 MG PO TABS: 300.0000 mg | ORAL_TABLET | Freq: Every day | ORAL | 5 refills | Status: DC

## 2015-10-13 NOTE — Progress Notes (Signed)
Follow-up Note  Referring Provider: Georgianne Fick, MD Primary Provider: Georgianne Fick, MD Date of Office Visit: 10/13/2015  Subjective:   Nathaniel Robertson (DOB: Sep 04, 1960) is a 55 y.o. male who [facility] on 10[facility]13/2015 in re-evaluation of the following:  HPI: Nathaniel Robertson presents this clinic in reevaluation of his asthma and allergic rhinitis and history of urticaria. I've not seen him in his clinic since February 2017.  During the interval he has not had any issues with his asthma. He has not required a systemic steroid. He can walk one hour without any difficulty and rarely uses a short acting bronchodilator. He sometimes does get out of breath if he has to exert himself to a very large degree. He's been using his Symbicort. He has not had to activate an action plan including addition of Qvar. He is very satisfied with the response that he has received to date regarding his therapy for asthma.  He has not been having any problems with his nose. He has not required an antibiotic for an episode of sinusitis. He continues to use Rhinocort a few times a week and montelukast.  His skin is much better while using hydration therapy and ointment. He does have triamcinolone at home that he rarely uses and just relies on the use of Vaseline. He still continues to have skin lesions that are old and are healing and he's not had any new lesions develop. He's been using loratadine for his itchiness.  He states that he has lots of throat clearing and has phlegm in his throat. This is been a long-standing issue of many years duration. Occasionally he feels as though there is a coating in his throat and he gets some slightly intermittent raspy voice. He does have reflux. He has regurgitation. He drinks 2 cups of coffee a day and occasionally chocolate.    Medication List      albuterol 108 (90 Base) MCG/ACT inhaler Commonly known as:  PROVENTIL HFA;VENTOLIN  HFA Inhale 2 puffs into the lungs every 6 (six) hours as needed for wheezing or shortness of breath.   atorvastatin 10 MG tablet Commonly known as:  LIPITOR Take 10 mg by mouth every morning.   beclomethasone 80 MCG/ACT inhaler Commonly known as:  QVAR Inhale 2 puffs into the lungs 2 (two) times daily. ADDED FOR ASTHMA FLAREUP   EPINEPHrine 0.3 mg/0.3 mL Soaj injection Commonly known as:  EPI-PEN Use as directed for life-threatening allergic reaction.   hydrOXYzine 25 MG tablet Commonly known as:  ATARAX/VISTARIL   loratadine 10 MG tablet Commonly known as:  CLARITIN Take 10 mg by mouth daily.   losartan 100 MG tablet Commonly known as:  COZAAR Take 100 mg by mouth daily.   metFORMIN 500 MG 24 hr tablet Commonly known as:  GLUCOPHAGE-XR as needed.   montelukast 10 MG tablet Commonly known as:  SINGULAIR Take 10 mg by mouth at bedtime. Reported on 06/23/2015   RHINOCORT ALLERGY 32 MCG/ACT nasal spray Generic drug:  budesonide Place 1 spray into both nostrils daily.   SYMBICORT IN Inhale 108 mcg into the lungs 2 (two) times daily.   triamcinolone cream 0.1 % Commonly known as:  KENALOG       Past Medical History:  Diagnosis Date  . Essential hypertension   . Hypertension   . Impaired fasting glucose   . Mild persistent asthma without complication   . Mixed hyperlipidemia   . Obstructive sleep apnea   . Rash   .  Snoring 12/31/2014    History reviewed. No pertinent surgical history.  No Known Allergies  Review of systems negative except as noted in HPI / PMHx or noted below:  Review of Systems  Constitutional: Negative.   HENT: Negative.   Eyes: Negative.   Respiratory: Negative.   Cardiovascular: Negative.   Gastrointestinal: Negative.   Genitourinary: Negative.   Musculoskeletal: Negative.   Skin: Negative.   Neurological: Negative.   Endo/Heme/Allergies: Negative.   Psychiatric/Behavioral: Negative.      Objective:   Vitals:   10/13/15  1055  BP: 138/74  Pulse: 78  Resp: 16  Temp: 98.8 F (37.1 C)          Physical Exam  Constitutional: He is well-developed, well-nourished, and in no distress.  Throat clearing, slightly raspy voice  HENT:  Head: Normocephalic.  Right Ear: Tympanic membrane, external ear and ear canal normal.  Left Ear: Tympanic membrane, external ear and ear canal normal.  Nose: Nose normal. No mucosal edema or rhinorrhea.  Mouth/Throat: Uvula is midline, oropharynx is clear and moist and mucous membranes are normal. No oropharyngeal exudate.  Eyes: Conjunctivae are normal.  Neck: Trachea normal. No tracheal tenderness present. No tracheal deviation present. No thyromegaly present.  Cardiovascular: Normal rate, regular rhythm, S1 normal, S2 normal and normal heart sounds.   No murmur heard. Pulmonary/Chest: Breath sounds normal. No stridor. No respiratory distress. He has no wheezes. He has no rales.  Musculoskeletal: He exhibits no edema.  Lymphadenopathy:       Head (right side): No tonsillar adenopathy present.       Head (left side): No tonsillar adenopathy present.    He has no cervical adenopathy.  Neurological: He is alert. Gait normal.  Skin: No rash noted. He is not diaphoretic. No erythema. Nails show no clubbing.  Psychiatric: Mood and affect normal.    Diagnostics:    Spirometry was performed and demonstrated an FEV1 of 2.12 at 63 % of predicted.  The patient had an Asthma Control Test with the following results: ACT Total Score: 23.    Assessment and Plan:   1. Asthma, moderate persistent, well-controlled   2. Allergic rhinoconjunctivitis   3. Oral allergy syndrome, initial encounter   4. Pruritic condition   5. LPRD (laryngopharyngeal reflux disease)     1. Continue to perform Allergen avoidance measures  2. "Action plan" for asthma flare up:   A. add Qvar 80 - 2 inhalations twice a day to Symbicort  B. use Ventolin HFA if needed  3. Continue to Treat and prevent  inflammation:   A. montelukast 10 mg one tablet one time per day  B. OTC Rhinocort one spray each nostril 3-7 times a week. Coupon.  C. Symbicort 160 - 2 inhalations twice a day  4. If needed:   A. cetirizine or loratadine 10 mg one tablet one-2 times a day  B. Ventolin HFA 2 puffs every 4-6 hours  C. EpiPen, Benadryl, M.D./ER for allergic reaction  D. ointment-based topical treatment especially after shower or bath  5. Treat reflux:   A. consolidate caffeine and chocolate use as much as possible  B. start omeprazole 40 mg in a.m.  C. start ranitidine 300 mg in PM  D. contact clinic in 4 weeks with update regarding response  6. Obtain fall flu vaccine  7. Return to clinic in 12 weeks or earlier if problem  Daisean has several issues that need to be addressed. Had a long talk with him today about  treatment for asthma including option of using a biological agent or changing his inhalers. At this point in time is very satisfied with the response he is received regarding his asthma therapy and does not want to change his treatments or add any additional treatment. He does appear to have a history very consistent with LPR and we'll initiate therapy over the course of the next 4 weeks to see if we get improvement regarding his throat clearing and drainage. He'll contact me by telephone with the response and will make a decision about how to proceed pending her response. If he does well I'll see him back in this clinic in 12 weeks.  [name], MD [facility]

## 2015-10-13 NOTE — Patient Instructions (Addendum)
  1. Continue to perform Allergen avoidance measures  2. "Action plan" for asthma flare up:   A. add Qvar 80 - 2 inhalations twice a day to Symbicort  B. use Ventolin HFA if needed  3. Continue to Treat and prevent inflammation:   A. montelukast 10 mg one tablet one time per day  B. OTC Rhinocort one spray each nostril 3-7 times a week. Coupon.  C. Symbicort 160 - 2 inhalations twice a day  4. If needed:   A. cetirizine or loratadine 10 mg one tablet one-2 times a day  B. Ventolin HFA 2 puffs every 4-6 hours  C. EpiPen, Benadryl, M.D./ER for allergic reaction  D. ointment-based topical treatment especially after shower or bath  5. Treat reflux:   A. consolidate caffeine and chocolate use as much as possible  B. start omeprazole 40 mg in a.m.  C. start ranitidine 300 mg in PM  D. contact clinic in 4 weeks with update regarding response  6. Obtain fall flu vaccine  7. Return to clinic in 12 weeks or earlier if problem

## 2016-01-05 ENCOUNTER — Ambulatory Visit: Payer: BLUE CROSS/BLUE SHIELD | Admitting: Allergy and Immunology

## 2017-02-17 ENCOUNTER — Emergency Department (HOSPITAL_COMMUNITY): Payer: BLUE CROSS/BLUE SHIELD

## 2017-02-17 ENCOUNTER — Encounter (HOSPITAL_COMMUNITY): Payer: Self-pay | Admitting: Nurse Practitioner

## 2017-02-17 ENCOUNTER — Emergency Department (HOSPITAL_COMMUNITY)
Admission: EM | Admit: 2017-02-17 | Discharge: 2017-02-18 | Disposition: A | Discharge status: Home / Self Care | Payer: BLUE CROSS/BLUE SHIELD | Attending: Emergency Medicine | Admitting: Emergency Medicine

## 2017-02-17 DIAGNOSIS — R030 Elevated blood-pressure reading, without diagnosis of hypertension: Secondary | ICD-10-CM | POA: Diagnosis not present

## 2017-02-17 DIAGNOSIS — J4530 Mild persistent asthma, uncomplicated: Secondary | ICD-10-CM | POA: Diagnosis not present

## 2017-02-17 DIAGNOSIS — Z7984 Long term (current) use of oral hypoglycemic drugs: Secondary | ICD-10-CM | POA: Diagnosis not present

## 2017-02-17 DIAGNOSIS — I10 Essential (primary) hypertension: Secondary | ICD-10-CM | POA: Diagnosis not present

## 2017-02-17 DIAGNOSIS — R519 Headache, unspecified: Secondary | ICD-10-CM

## 2017-02-17 DIAGNOSIS — R51 Headache: Secondary | ICD-10-CM | POA: Diagnosis present

## 2017-02-17 DIAGNOSIS — Z79899 Other long term (current) drug therapy: Secondary | ICD-10-CM | POA: Diagnosis not present

## 2017-02-17 LAB — BASIC METABOLIC PANEL
ANION GAP: 8 (ref 5–15)
BUN: 18 mg/dL (ref 6–20)
CHLORIDE: 105 mmol/L (ref 101–111)
CO2: 24 mmol/L (ref 22–32)
Calcium: 9.5 mg/dL (ref 8.9–10.3)
Creatinine, Ser: 1.2 mg/dL (ref 0.61–1.24)
GFR calc Af Amer: >60 mL/min (ref >60)
GLUCOSE: 107 mg/dL — AB (ref 65–99)
POTASSIUM: 3.8 mmol/L (ref 3.5–5.1)
Sodium: 137 mmol/L (ref 135–145)

## 2017-02-17 LAB — CBC
HCT: 41.5 % (ref 39.0–52.0)
Hemoglobin: 13.7 g/dL (ref 13.0–17.0)
MCH: 27.5 pg (ref 26.0–34.0)
MCHC: 33 g/dL (ref 30.0–36.0)
MCV: 83.3 fL (ref 78.0–100.0)
Platelets: 199 10*3/uL (ref 150–400)
RBC: 4.98 MIL/uL (ref 4.22–5.81)
RDW: 14.3 % (ref 11.5–15.5)
WBC: 8.2 10*3/uL (ref 4.0–10.5)

## 2017-02-17 LAB — I-STAT TROPONIN, ED: TROPONIN I, POC: 0 ng/mL (ref 0.00–0.08)

## 2017-02-17 MED ORDER — LABETALOL HCL 5 MG/ML IV SOLN
20.0000 mg | Freq: Once | INTRAVENOUS | Status: DC
  Administered 2017-02-17: 20 mg via INTRAVENOUS
  Filled 2017-02-17: qty 4

## 2017-02-17 NOTE — ED Triage Notes (Signed)
Pt is c/o consistent elevated BP that he has been keeping track of, he stated that he started doing so when he noticed that he is experiencing left sided body discomfort and increased headache and unexplained neck/shoulder/jaw pain.

## 2017-02-17 NOTE — ED Provider Notes (Signed)
[facility]-EMERGENCY DEPT Provider Note   CSN: [id number] Arrival date & time: 02/17/17  1914     History   Chief Complaint Chief Complaint  Patient presents with  . Headache  . Hypertension  . Neck Pain    HPI Nathaniel Robertson is a 56 y.o. male.  Patient with past medical history remarkable for hypertension, presents to the emergency department with chief complaint of tension and headache.  He states that the symptoms started yesterday and have been persistent today.  He states that he doubled up on his blood pressure medication, but his blood pressure remained high.  He states that he called his doctor, and was told to come to the emergency department.  He reports some associated neck pain, left arm pain, and left leg pain.  He states that he does have chronic pain in these areas, but thinks that it he has tried taking Zanaflex with no relief.  He denies any vision changes, speech changes, numbness, weakness, or tingling.  He denies any other associated symptoms.   The history is provided by the patient. No language interpreter was used.    Past Medical History:  Diagnosis Date  . Essential hypertension   . Hypertension   . Impaired fasting glucose   . Mild persistent asthma without complication   . Mixed hyperlipidemia   . Obstructive sleep apnea   . Rash   . Snoring 12/31/2014    Patient Active Problem List   Diagnosis Date Noted  . Snoring 12/31/2014  . 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 Obesity (BMI 35.0-39.9 without comorbidity) 12/31/2014    History reviewed. No pertinent surgical history.     Home Medications    Prior to Admission medications   Medication Sig Start Date End Date Taking? Authorizing Provider  albuterol (PROVENTIL HFA;VENTOLIN HFA) 108 (90 BASE) MCG/ACT inhaler Inhale 2 puffs into the lungs every 6 (six) hours as needed for wheezing or shortness of breath.   Yes [provider]  atorvastatin (LIPITOR)  10 MG tablet Take 10 mg by mouth every morning.   Yes [provider]  Budesonide-Formoterol Fumarate (SYMBICORT IN) Inhale 1 puff into the lungs 2 (two) times daily.    Yes [provider]  EPINEPHrine 0.3 mg/0.3 mL IJ SOAJ injection Use as directed for life-threatening allergic reaction. 06/23/15  Yes Kozlow, Alvira PhilipsEric J, MD  hydrOXYzine (ATARAX/VISTARIL) 25 MG tablet  08/16/15  Yes [provider]  losartan (COZAAR) 100 MG tablet Take 100 mg by mouth daily. 04/28/15  Yes [provider]  metFORMIN (GLUCOPHAGE-XR) 500 MG 24 hr tablet Take 500 mg by mouth at bedtime.  04/28/15  Yes [provider]  montelukast (SINGULAIR) 10 MG tablet Take 10 mg by mouth at bedtime. Reported on 06/23/2015   Yes [provider]  ranitidine (ZANTAC) 300 MG tablet Take 1 tablet (300 mg total) by mouth at bedtime. 10/13/15  Yes Kozlow, Alvira PhilipsEric J, MD  tiZANidine (ZANAFLEX) 4 MG tablet Take 4 mg by mouth at bedtime. 10/21/16  Yes [provider]  beclomethasone (QVAR) 80 MCG/ACT inhaler Inhale 2 puffs into the lungs 2 (two) times daily. ADDED FOR ASTHMA FLAREUP Patient not taking: Reported on 10/13/2015 07/21/15   Jessica PriestKozlow, Eric J, MD  omeprazole (PRILOSEC) 40 MG capsule Take one tablet every morning as directed. Patient not taking: Reported on 02/17/2017 10/13/15   Jessica PriestKozlow, Eric J, MD    Family History Family History  Problem Relation Age of Onset  . Diabetes Mother   .  Asthma Father     Social History Social History   Tobacco Use  . Smoking status: Never Smoker  . Smokeless tobacco: Never Used  Substance Use Topics  . Alcohol use: No    Alcohol/week: 0.0 oz    Comment: occasional  . Drug use: No     Allergies   Patient has no known allergies.   Review of Systems Review of Systems  All other systems reviewed and are negative.    Physical Exam Updated Vital Signs BP (!) 147/117   Pulse 89   Temp 98.4 F (36.9 C) (Oral)   Resp 17   SpO2 99%    Physical Exam  Constitutional: He is oriented to person, place, and time. He appears well-developed and well-nourished.  HENT:  Head: Normocephalic and atraumatic.  Eyes: Conjunctivae and EOM are normal. Pupils are equal, round, and reactive to light. Right eye exhibits no discharge. Left eye exhibits no discharge. No scleral icterus.  Neck: Normal range of motion. Neck supple. No JVD present.  Cardiovascular: Normal rate, regular rhythm and normal heart sounds. Exam reveals no gallop and no friction rub.  No murmur heard. Pulmonary/Chest: Effort normal and breath sounds normal. No respiratory distress. He has no wheezes. He has no rales. He exhibits no tenderness.  Abdominal: Soft. He exhibits no distension and no mass. There is no tenderness. There is no rebound and no guarding.  Musculoskeletal: Normal range of motion. He exhibits no edema or tenderness.  Neurological: He is alert and oriented to person, place, and time.  Skin: Skin is warm and dry.  Psychiatric: He has a normal mood and affect. His behavior is normal. Judgment and thought content normal.  Nursing note and vitals reviewed.    ED Treatments / Results  Labs (all labs ordered are listed, but only abnormal results are displayed) Labs Reviewed  BASIC METABOLIC PANEL - Abnormal; Notable for the following components:      Result Value   Glucose, Bld 107 (*)    All other components within normal limits  CBC  I-STAT TROPONIN, ED    EKG  EKG Interpretation None       Radiology Dg Chest 2 View  Result Date: 02/17/2017 CLINICAL DATA:  56 y/o  M; chest pain. EXAM: CHEST  2 VIEW COMPARISON:  09/16/2012 chest radiograph FINDINGS: Stable heart size and mediastinal contours are within normal limits. Both lungs are clear. The visualized skeletal structures are unremarkable. IMPRESSION: No acute pulmonary process identified. Electronically Signed   By: Mitzi Hansen M.D.   On: 02/17/2017 21:52     Procedures Procedures (including critical care time)  Medications Ordered in ED Medications  labetalol (NORMODYNE,TRANDATE) injection 20 mg (not administered)     Initial Impression / Assessment and Plan / ED Course  I have reviewed the triage vital signs and the nursing notes.  Pertinent labs & imaging results that were available during my care of the patient were reviewed by me and considered in my medical decision making (see chart for details).     Patient with headache and hypertension.  Laboratory workup ordered in triage is reassuring.  No evidence of endorgan damage.  We will give a dose of labetalol.  Labs, and imaging are reassuring.  Blood pressure trending down nicely.  Patient feels improved.  We will discharged home with PCP follow-up.  Final Clinical Impressions(s) / ED Diagnoses   Final diagnoses:  Elevated blood pressure reading  Nonintractable headache, unspecified chronicity pattern, unspecified headache type  ED Discharge Orders    None       Roxy HorsemanBrowning, Jamesetta Greenhalgh, Cordelia Poche-C 02/18/17 [id number]    Bethann BerkshireZammit, Joseph, MD 02/18/17 [phone]

## 2017-02-18 NOTE — ED Notes (Signed)
Pt is feeling better.  HA less.  Juice given

## 2017-08-25 ENCOUNTER — Other Ambulatory Visit: Payer: Self-pay | Admitting: Rehabilitation

## 2017-08-25 DIAGNOSIS — M47812 Spondylosis without myelopathy or radiculopathy, cervical region: Secondary | ICD-10-CM

## 2017-08-25 DIAGNOSIS — M5136 Other intervertebral disc degeneration, lumbar region: Secondary | ICD-10-CM

## 2017-09-04 ENCOUNTER — Ambulatory Visit
Admission: RE | Admit: 2017-09-04 | Discharge: 2017-09-04 | Disposition: A | Discharge status: Home / Self Care | Payer: BLUE CROSS/BLUE SHIELD | Source: Ambulatory Visit | Attending: Rehabilitation | Admitting: Rehabilitation

## 2017-09-04 DIAGNOSIS — M5136 Other intervertebral disc degeneration, lumbar region: Secondary | ICD-10-CM

## 2017-09-04 DIAGNOSIS — M47812 Spondylosis without myelopathy or radiculopathy, cervical region: Secondary | ICD-10-CM

## 2017-10-24 ENCOUNTER — Encounter: Payer: Self-pay | Admitting: Neurology

## 2017-10-25 ENCOUNTER — Encounter: Payer: Self-pay | Admitting: Neurology

## 2017-10-25 ENCOUNTER — Ambulatory Visit: Payer: BLUE CROSS/BLUE SHIELD | Admitting: Neurology

## 2017-10-25 VITALS — BP 143/93 | HR 63 | Ht 71.0 in | Wt 234.0 lb

## 2017-10-25 DIAGNOSIS — G4700 Insomnia, unspecified: Secondary | ICD-10-CM

## 2017-10-25 DIAGNOSIS — G4733 Obstructive sleep apnea (adult) (pediatric): Secondary | ICD-10-CM

## 2017-10-25 DIAGNOSIS — E669 Obesity, unspecified: Secondary | ICD-10-CM | POA: Diagnosis not present

## 2017-10-25 DIAGNOSIS — Z9989 Dependence on other enabling machines and devices: Secondary | ICD-10-CM

## 2017-10-25 DIAGNOSIS — I10 Essential (primary) hypertension: Secondary | ICD-10-CM | POA: Diagnosis not present

## 2017-10-25 DIAGNOSIS — G4710 Hypersomnia, unspecified: Secondary | ICD-10-CM

## 2017-10-25 DIAGNOSIS — R0683 Snoring: Secondary | ICD-10-CM | POA: Diagnosis not present

## 2017-10-25 DIAGNOSIS — G4730 Sleep apnea, unspecified: Secondary | ICD-10-CM
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
## 2017-11-06 ENCOUNTER — Ambulatory Visit: Payer: BLUE CROSS/BLUE SHIELD | Admitting: Neurology

## 2017-11-06 DIAGNOSIS — G4733 Obstructive sleep apnea (adult) (pediatric): Secondary | ICD-10-CM

## 2017-11-06 DIAGNOSIS — G4700 Insomnia, unspecified: Secondary | ICD-10-CM

## 2017-11-06 DIAGNOSIS — G4730 Sleep apnea, unspecified: Secondary | ICD-10-CM

## 2017-11-06 DIAGNOSIS — R0683 Snoring: Secondary | ICD-10-CM

## 2017-11-06 DIAGNOSIS — E669 Obesity, unspecified: Secondary | ICD-10-CM

## 2017-11-06 DIAGNOSIS — I10 Essential (primary) hypertension: Secondary | ICD-10-CM

## 2017-11-06 DIAGNOSIS — G4710 Hypersomnia, unspecified: Secondary | ICD-10-CM

## 2017-11-12 DIAGNOSIS — G4733 Obstructive sleep apnea (adult) (pediatric): Secondary | ICD-10-CM | POA: Insufficient documentation

## 2017-11-12 DIAGNOSIS — I10 Essential (primary) hypertension: Secondary | ICD-10-CM | POA: Insufficient documentation

## 2017-11-12 NOTE — Procedures (Signed)
[facility] [address] [place], [place] [address] NAME:    Zyrion Coey                                                            DOB: 27-Aug-1960 MEDICAL RECORD No: [id number]                                            DOS: 11/06/2017 REFERRING PHYSICIAN: Georgianne Fick, MD STUDY PERFORMED: Home Sleep Study on Apnea Link HISTORY: Jasten Sangiovanni is a 57 y.o. male patient and was seen here after a 3 year hiatus for finally following up on CPAP recommendation made 02-01-2015 . He then did not follow here, did not respond to calls from the DME but chose to obtain a dental device for mandibular advancement. He has not had the device made to measure- just bought on [employer]. His wife has not noted any impact on snoring.  Mr. Shaff is a 57 year old man of [place] descent , whom I had last seen in my practice in October 2016 for sleep consultation, at the time a split-night polysomnography was ordered which was performed on 01 February 2015, the patient had a good sleep efficiency, short sleep latency and was diagnosed with moderate obstructive sleep apnea at 23.3 AHI, REM apnea was much increased to 66/h, the patient did not sleep in supine position, total desaturation index was 22.5, with 38.4 minutes of total desaturation time.  The arousal index was rather high at 22.5, his EKG stayed in normal sinus rhythm he was titrated to 9 cm water pressure with a resolution of apnea, the order issued was for an auto titration CPAP between 6 and 10 cmH2O was 1 cm EPR at the time he was placed on a nasal pillow the so-called ResMed air fit P 10 medium size.  The program did show a fairly typical REM distribution with the majority of REM after 2 AM, there were no central apneas arising under CPAP therapy.    Epworth score 1/24 points, Fatigue severity score 39/63 points, BMI: 32.64  STUDY RESULTS:  Total Recording Time:  9 hours 42 minutes; 7 h and 5 min.  Total Apnea/Hypopnea  Index (AHI):  28.1 /h:  RDI: 31.2 /h- moderate - severe Average Oxygen Saturation:  94 %; Lowest Oxygen Desaturation: 83 %  Total Time Oxygen Saturation below 89 %: 4.0 minutes  Average Heart Rate:  65 bpm (between 48 and 150 bpm) IMPRESSION:  1) The patient continues to have moderate-severe obstructive sleep apnea with loud snoring. 2) No clinically significant degree of hypoxia, but brady-tachycardia was noted.  RECOMMENDATION: CPAP auto-titration device 5-18 cm water pressure with 3 cm EPR and mask to be fitted( tried an AirFit p10 medium) , heated humidity to be provided.  Follow up after 60-90 days of CPAP use. Compliance is defined as 4 hours or more of nightly use.  I certify that I have reviewed the raw data recording prior to the issuance of this report in accordance with the standards of the American Academy of Sleep Medicine (AASM). Melvyn Novas, M.D.   11-11-2017      Medical Director of [facility], accredited by the AASM. Diplomat of the ABPN and ABSM.

## 2017-11-12 NOTE — Addendum Note (Signed)
Addended by: Melvyn NovasHMEIER, Taft Worthing on: 11/12/2017 01:41 PM   Modules accepted: Orders

## 2017-11-13 ENCOUNTER — Telehealth: Payer: Self-pay | Admitting: Neurology

## 2017-11-13 NOTE — Telephone Encounter (Signed)
-----   Message from Melvyn Novasarmen Dohmeier, MD sent at 11/12/2017  1:41 PM EDT ----- IMPRESSION:  1) The patient continues to have moderate-severe obstructive sleep apnea with loud snoring. 2) No clinically significant degree of hypoxia, but brady-tachycardia was noted.  RECOMMENDATION: CPAP auto-titration device 5-18 cm water pressure with 3 cm EPR and mask to be fitted( tried an AirFit p10 medium) , heated humidity to be provided.  Follow up after 60-90 days of CPAP use. Compliance is defined as 4 hours or more of nightly use.

## 2017-11-13 NOTE — Telephone Encounter (Signed)
I called pt. I advised pt that Dr. Vickey Hugerohmeier reviewed their sleep study results and found that pt has sleep apnea. Dr. Vickey Hugerohmeier recommends that pt starts a auto CPAP. I reviewed PAP compliance expectations with the pt. Pt is agreeable to starting a CPAP. I advised pt that an order will be sent to a DME, [facility], and [facility] will call the pt within about one week after they file with the pt's insurance. [facility] will show the pt how to use the machine, fit for masks, and troubleshoot the CPAP if needed. A follow up appt was made for insurance purposes with Dr. Vickey Hugerohmeier on Nov 18,2019 at 9:30 am. Pt verbalized understanding to arrive 15 minutes early and bring their CPAP. A letter with all of this information in it will be mailed to the pt as a reminder. I verified with the pt that the address we have on file is correct. Pt verbalized understanding of results. Pt had no questions at this time but was encouraged to call back if questions arise.

## 2018-01-30 ENCOUNTER — Encounter: Payer: Self-pay | Admitting: Neurology

## 2018-02-05 ENCOUNTER — Ambulatory Visit: Payer: BLUE CROSS/BLUE SHIELD | Admitting: Neurology

## 2018-02-05 ENCOUNTER — Encounter: Payer: Self-pay | Admitting: Neurology

## 2018-02-05 VITALS — BP 147/103 | HR 107 | Ht 71.0 in | Wt 236.0 lb

## 2018-02-05 DIAGNOSIS — G4710 Hypersomnia, unspecified: Secondary | ICD-10-CM | POA: Diagnosis not present

## 2018-02-05 DIAGNOSIS — G4730 Sleep apnea, unspecified: Secondary | ICD-10-CM

## 2018-02-05 DIAGNOSIS — G4733 Obstructive sleep apnea (adult) (pediatric): Secondary | ICD-10-CM

## 2018-02-05 DIAGNOSIS — J310 Chronic rhinitis: Secondary | ICD-10-CM

## 2018-02-05 DIAGNOSIS — E669 Obesity, unspecified: Secondary | ICD-10-CM

## 2018-02-05 DIAGNOSIS — Z9989 Dependence on other enabling machines and devices: Secondary | ICD-10-CM

## 2018-02-05 MED ORDER — MOMETASONE FUROATE 50 MCG/ACT NA SUSP: 2.0000 | Freq: Every evening | NASAL | 12 refills | Status: DC | PRN
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
## 2018-02-05 NOTE — Patient Instructions (Signed)
He will try a different nasal mask- he likes to try a Bella swift nasal mask- needs to be fitted.

## 2018-10-27 IMAGING — MR MR LUMBAR SPINE W/O CM
4 of 5 series · 19 of 48 positions shown · non-contrast
Comparison: Lumbar spine radiographs 04/18/2003

CLINICAL DATA: Lumbar disc degeneration.

EXAM:
MRI LUMBAR SPINE WITHOUT CONTRAST
TECHNIQUE: Multiplanar, multisequence MR imaging of the lumbar spine was
performed. No intravenous contrast was administered.

[Series 6: T2 · sagittal · 4.0mm · 0.73mm/px · 6 of 13 slices shown (1 of 2)]
[im 1/13]
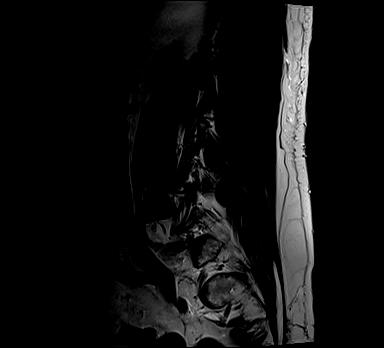
[im 3/13]
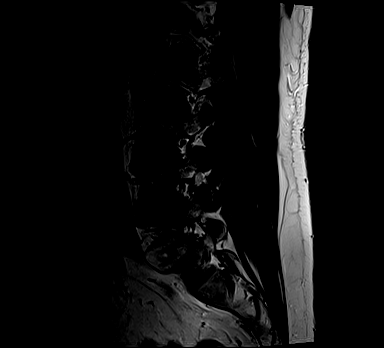
[im 5/13]
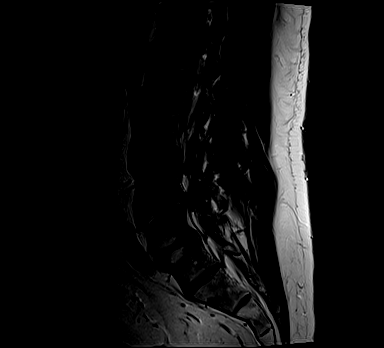
[im 8/13]
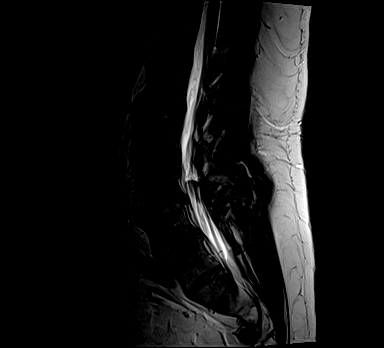
[im 10/13]
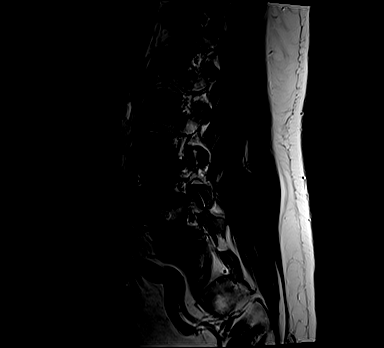
[im 13/13]
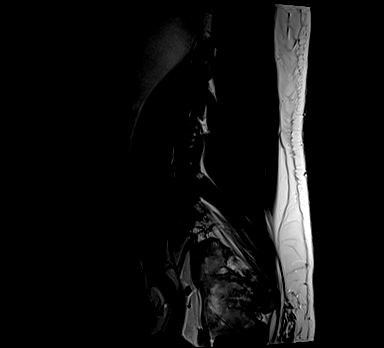

[Series 7: T1 · sagittal · 4.0mm · 0.73mm/px · 3 of 13 slices shown (1 of 2)]
[im 1/13]
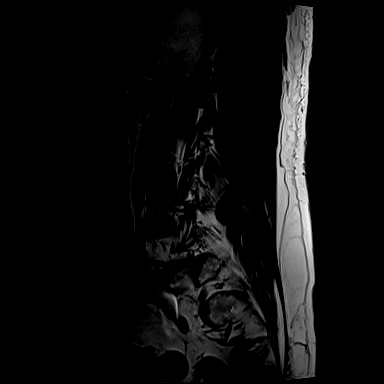
[im 7/13]
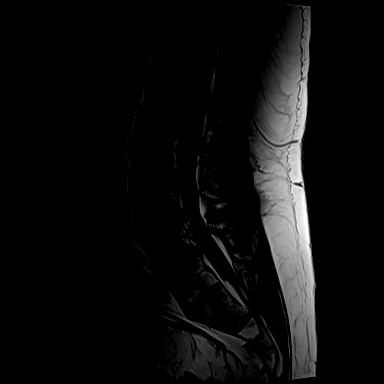
[im 13/13]
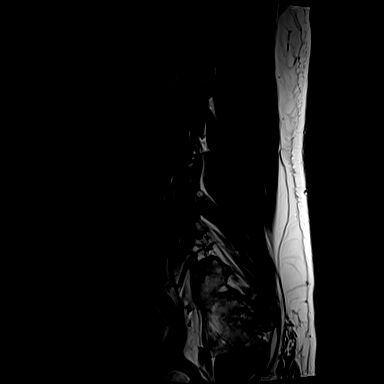

[Series 11: T1 · axial · 4.0mm · 0.28mm/px · z∈[-119,+42]mm · 3 of 39 slices shown (2 of 2)]
[im 6/39]
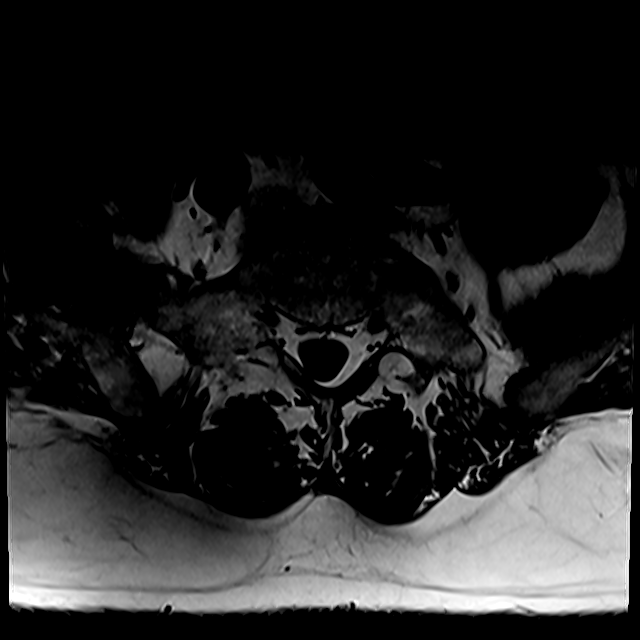
[im 21/39]
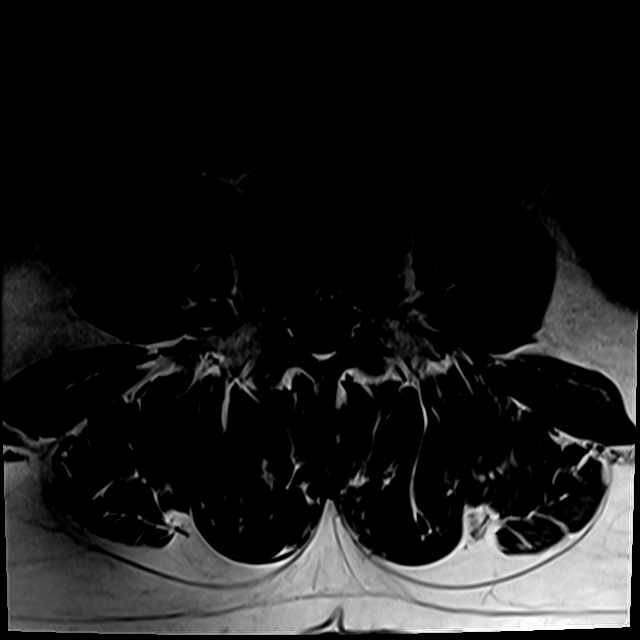
[im 33/39]
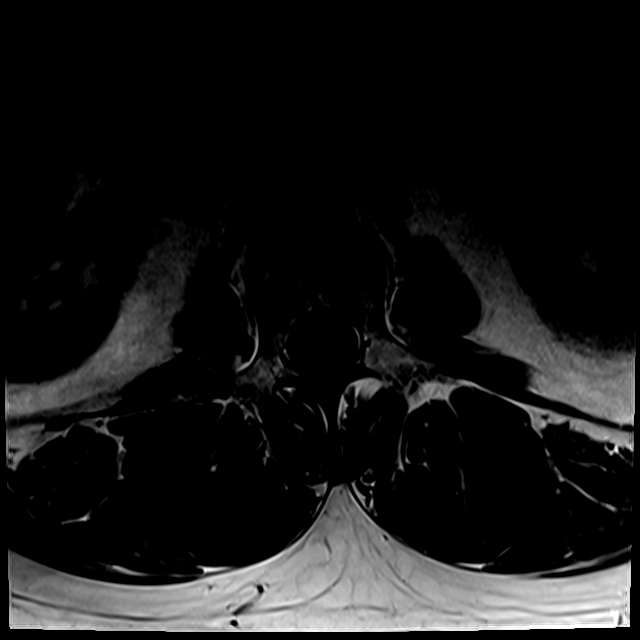

[Series 14: T2 · axial · 4.0mm · 0.28mm/px · z∈[-132,+42]mm · 7 of 39 slices shown (2 of 2)]
[im 3/39]
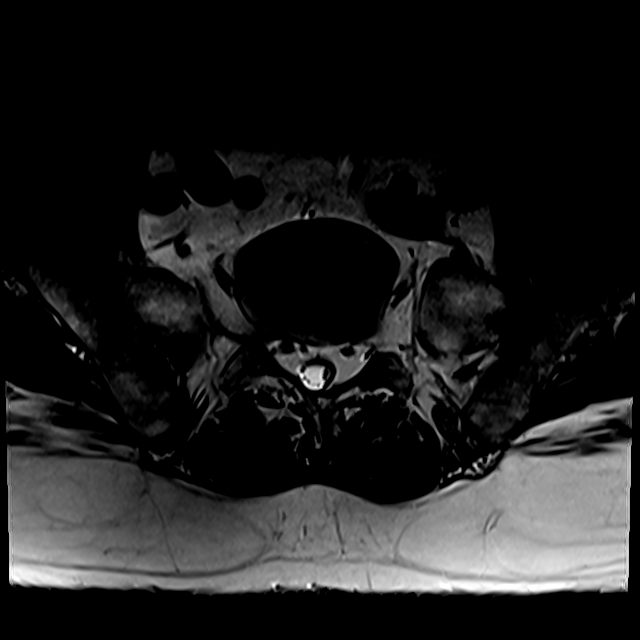
[im 6/39]
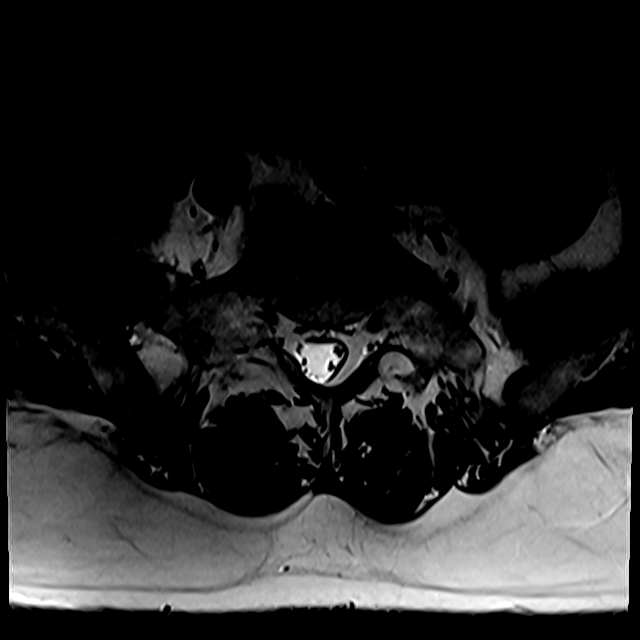
[im 8/39]
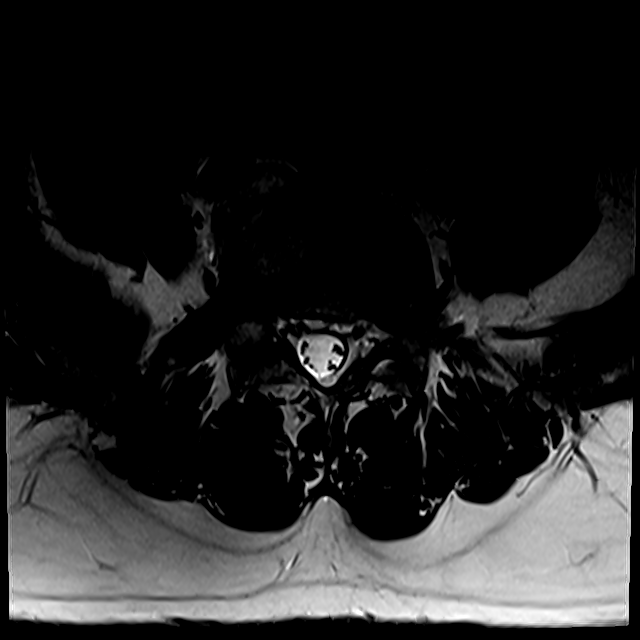
[im 13/39]
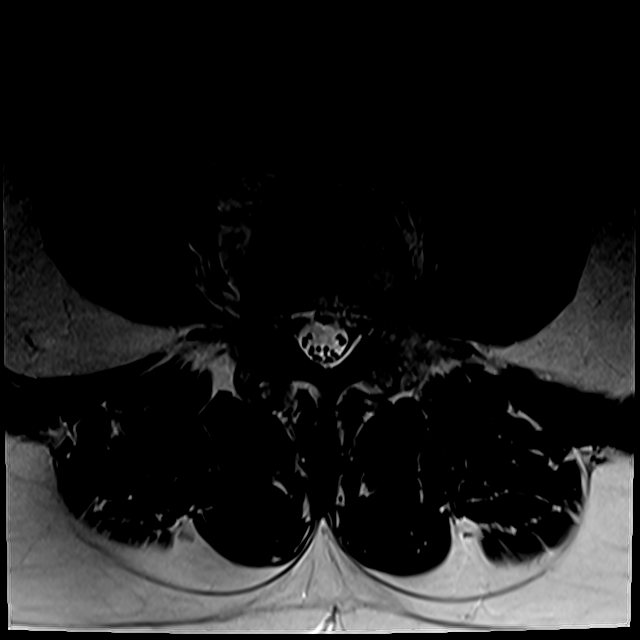
[im 18/39]
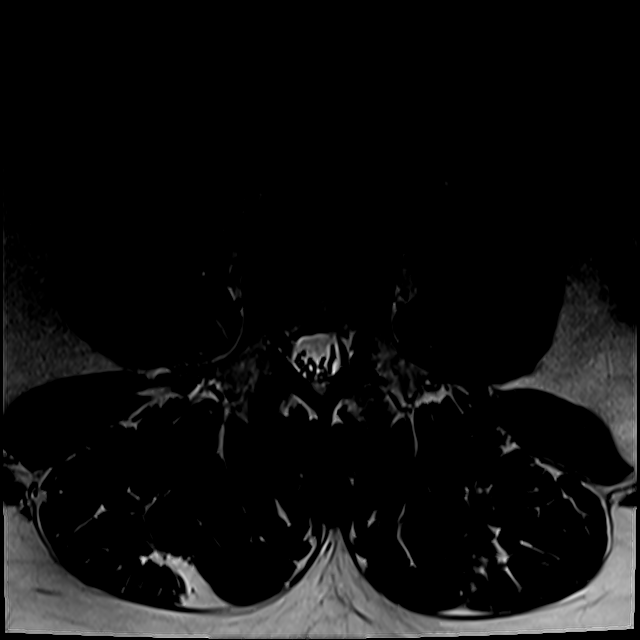
[im 21/39]
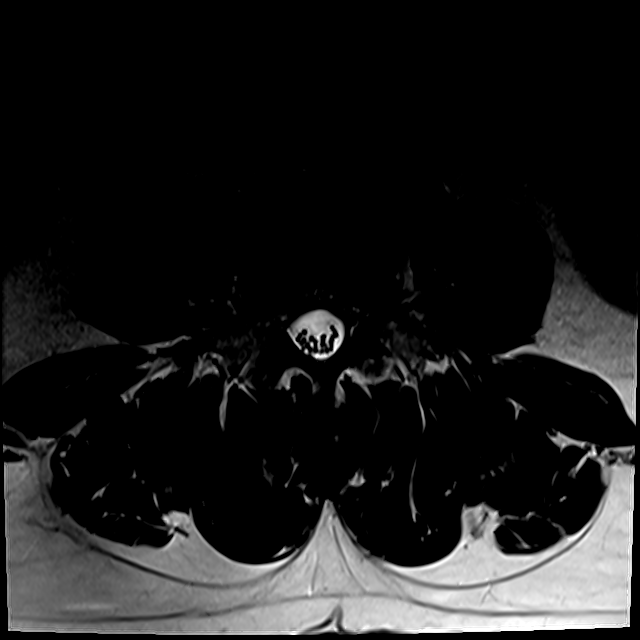
[im 33/39]
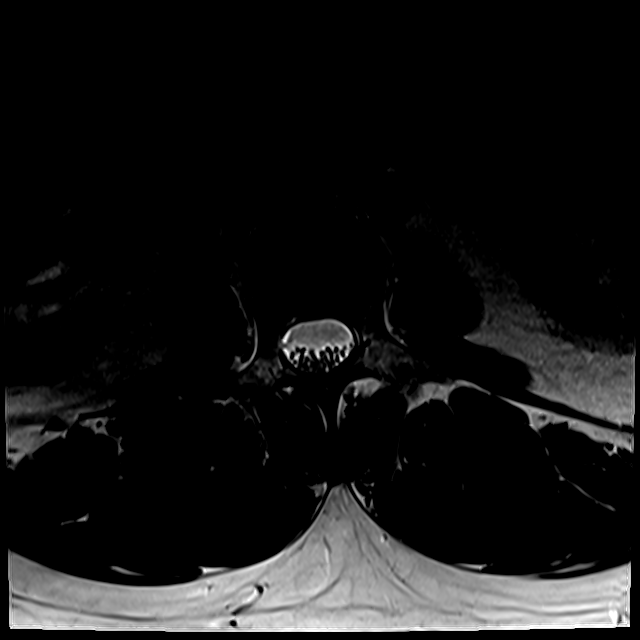

[19 of 48 positions shown; findings below may reference images not displayed]

FINDINGS: Segmentation:  Normal

Alignment:  Mild retrolisthesis L1-2 and L2-3 L3-4.

Vertebrae:  Negative for fracture or mass.

Conus medullaris and cauda equina: Conus extends to the L1 level.
Conus and cauda equina appear normal.

Paraspinal and other soft tissues: No paraspinous mass or soft
tissue thickening

Disc levels:

L1-2: Mild retrolisthesis. Small right foraminal disc protrusion
with mild right foraminal narrowing. Negative for nerve root
compression. Spinal canal adequate in size

L2-3: Mild retrolisthesis. Mild disc and facet degeneration without
stenosis

L3-4: Disc degeneration. Broad-based disc bulging and spurring.
Bilateral facet hypertrophy. Schmorl's node superior endplate of L4
on the left. Mild subarticular stenosis bilaterally.

L4-5: Progressive disc degeneration and disc space narrowing since
prior study. Diffuse endplate spurring left greater than right.
Subarticular stenosis present on the left. Spinal canal is normal in
size

L5-S1: Negative
IMPRESSION: Small right foraminal disc protrusion L1-2

Disc and facet degeneration L3-4. Mild subarticular stenosis
bilaterally due to spurring.

Progressive disc degeneration L4-5 with disc space narrowing and
spurring. Subarticular stenosis on the left.

## 2019-08-23 ENCOUNTER — Other Ambulatory Visit: Payer: Self-pay

## 2019-08-23 ENCOUNTER — Ambulatory Visit (INDEPENDENT_AMBULATORY_CARE_PROVIDER_SITE_OTHER): Payer: 59

## 2019-08-23 ENCOUNTER — Ambulatory Visit: Payer: 59 | Admitting: Orthopedic Surgery

## 2019-08-23 ENCOUNTER — Encounter: Payer: Self-pay | Admitting: Orthopedic Surgery

## 2019-08-23 ENCOUNTER — Ambulatory Visit: Payer: Self-pay

## 2019-08-23 ENCOUNTER — Telehealth: Payer: Self-pay | Admitting: Orthopedic Surgery

## 2019-08-23 DIAGNOSIS — M79601 Pain in right arm: Secondary | ICD-10-CM

## 2019-08-23 DIAGNOSIS — M1711 Unilateral primary osteoarthritis, right knee: Secondary | ICD-10-CM

## 2019-08-23 DIAGNOSIS — M19011 Primary osteoarthritis, right shoulder: Secondary | ICD-10-CM | POA: Diagnosis not present

## 2019-08-23 DIAGNOSIS — M25561 Pain in right knee: Secondary | ICD-10-CM

## 2019-08-23 NOTE — Telephone Encounter (Signed)
Pls advise.  

## 2019-08-23 NOTE — Telephone Encounter (Signed)
S/w patient and advised. Verbalized understanding.  

## 2019-08-23 NOTE — Telephone Encounter (Signed)
Patient stopped by on his way out.   He has an injection today and wants to know he can get another in the opposite shoulder.   Call back: [phone]

## 2019-08-23 NOTE — Progress Notes (Signed)
Subjective: Patient is here for ultrasound-guided intra-articular right glenohumeral injection.  Has DJD on x-ray.  Objective: Full range of motion with pain at the extremes.  Procedure: Ultrasound-guided right glenohumeral injection: After sterile prep with Betadine, injected 8 cc 1% lidocaine without epinephrine and 40 mg methylprednisolone using a 22-gauge spinal needle, passing the needle from posterior approach into the glenohumeral joint.  Injectate was seen filling the joint capsule.  Good immediate relief.

## 2019-08-24 ENCOUNTER — Encounter: Payer: Self-pay | Admitting: Orthopedic Surgery

## 2019-08-24 NOTE — Progress Notes (Signed)
Office Visit Note   Patient: Nathaniel Robertson           Date of Birth: [birth year]           MRN: [id number] Visit Date: 08/23/2019 Requested by: Merrilee Seashore, [address],  [place] [address] PCP: Merrilee Seashore, MD  Subjective: Chief Complaint  Patient presents with   Right Shoulder - Pain   Right Knee - Pain    HPI: Nathaniel Robertson is a 59 y.o. male who presents to the office complaining of right knee and right shoulder pain.  Patient notes that he has had 6 months of right knee pain.  He denies any injury.  He states that he has been walking more frequently and he notices it more when he walks.  Wearing sneakers makes his pain worse.  He localizes pain to the medial aspect of the right knee.  He denies any groin pain but does note low back pain with occasional radicular pain.  He has clicking and grinding in the right knee without frank locking or instability.  He takes oral Voltaren with some relief.  He has no history of knee surgery or knee injection.  He works as a Garment/textile technologist.  Regarding the right shoulder, he denies any injury.  He is right-hand dominant.  He notes painful range of motion that wakes him up at night when he lays on that side.  He does note subjective weakness of the right shoulder as well as stiffness.  He has a history of multiple cervical nerve ablations but he has never had surgery on his neck or shoulder.  Pain radiates down his arm into his forearm.  He notes numbness and tingling occasionally in his hand as well as neck pain.  He has a history of diabetes with his last A1c 6.8.  He has never had an injection in his neck or shoulder..                ROS:  All systems reviewed are negative as they relate to the chief complaint within the history of present illness.  Patient denies fevers or chills.  Assessment & Plan: Visit Diagnoses:  1. Primary osteoarthritis, right shoulder   2. Right arm pain   3. Right knee pain,  unspecified chronicity   4. Unilateral primary osteoarthritis, right knee     Plan: Patient is a 59 year old male who presents complaining of right knee and right shoulder pain.  His main concern today is his right shoulder pain.  It is waking him up at night and interrupting his daily life.  Radiographs taken of the right shoulder and cervical spine today reveal degenerative changes throughout the cervical spine as well as glenohumeral osteoarthritis.  He has excellent strength on exam but he does have pain elicited in the shoulder with passive range of motion throughout the exam.  His motion is fairly well-preserved compared with the other side but he is lacking a little bit of external rotation compared to his left shoulder.  Referred patient to Dr. Junius Roads for a right shoulder glenohumeral injection due to the more muscular nature of this patient's shoulder.  Regarding the right knee, he is having pain with walking but it does not limit his lifestyle as pain improves after 10 to 15 minutes of walking.  He has no mechanical symptoms or instability.  He has no injury.  Radiographs taken today of the right knee reveal medial compartment osteoarthritis.  Discussed options available to  patient.  He wishes to proceed with a right knee injection.  He will have to wait until next week due to his history of diabetes so that his blood sugar does not spike to high.  Patient understands.  Plan to follow-up in 1 week for right knee injection.  Follow-Up Instructions: No follow-ups on file.   Orders:  Orders Placed This Encounter  Procedures   XR Shoulder Right   XR Cervical Spine 2 or 3 views   XR KNEE 3 VIEW RIGHT   US Guided Needle Placement - No Linked Charges   No orders of the defined types were placed in this encounter.     Procedures: No procedures performed   Clinical Data: No additional findings.  Objective: Vital Signs: There were no vitals taken for this visit.  Physical Exam:    Constitutional: Patient appears well-developed HEENT:  Head: Normocephalic Eyes:EOM are normal Neck: Normal range of motion Cardiovascular: Normal rate Pulmonary/chest: Effort normal Neurologic: Patient is alert Skin: Skin is warm Psychiatric: Patient has normal mood and affect    Ortho Exam: Ortho exam demonstrates reasonable cervical spine range of motion.  Does have restricted external rotation by about 10 degrees on the right compared to the left.  Rotator cuff strength is intact infraspinatus supraspinatus and subscap muscle testing on the right-hand side.  No discrete AC joint tenderness is present in the right shoulder girdle region.  Specialty Comments:  No specialty comments available.  Imaging: No results found.   PMFS History: Patient Active Problem List   Diagnosis Date Noted   HTN, goal below 130/80 11/12/2017   Severe obstructive sleep apnea 11/12/2017   Snoring 12/31/2014   Hypersomnia with sleep apnea 12/31/2014   Obesity (BMI 35.0-39.9 without comorbidity) 12/31/2014   Past Medical History:  Diagnosis Date   Essential hypertension    Hypertension    Impaired fasting glucose    Mild persistent asthma without complication    Mixed hyperlipidemia    Obstructive sleep apnea    Rash    Snoring 12/31/2014    Family History  Problem Relation Age of Onset   Diabetes Mother    Asthma Father     Past Surgical History:  Procedure Laterality Date   COLONOSCOPY     Social History   Occupational History   Occupation: travel agent  Tobacco Use   Smoking status: Never Smoker   Smokeless tobacco: Never Used  Substance and Sexual Activity   Alcohol use: No    Alcohol/week: 0.0 standard drinks    Comment: occasional   Drug use: No   Sexual activity: Not on file

## 2019-08-25 ENCOUNTER — Encounter: Payer: Self-pay | Admitting: Orthopedic Surgery

## 2019-08-27 ENCOUNTER — Other Ambulatory Visit: Payer: Self-pay | Admitting: Internal Medicine

## 2019-08-27 DIAGNOSIS — E041 Nontoxic single thyroid nodule: Secondary | ICD-10-CM

## 2019-08-30 ENCOUNTER — Ambulatory Visit (INDEPENDENT_AMBULATORY_CARE_PROVIDER_SITE_OTHER): Payer: 59 | Admitting: Surgical

## 2019-08-30 DIAGNOSIS — M1711 Unilateral primary osteoarthritis, right knee: Secondary | ICD-10-CM | POA: Diagnosis not present

## 2019-08-31 ENCOUNTER — Encounter: Payer: Self-pay | Admitting: Surgical

## 2019-08-31 DIAGNOSIS — M1711 Unilateral primary osteoarthritis, right knee: Secondary | ICD-10-CM

## 2019-08-31 NOTE — Progress Notes (Signed)
   Procedure Note  Patient: Roberta Kelly             Date of Birth: 11-09-1960           MRN: [id number]             Visit Date: 08/30/2019  Procedures: Visit Diagnoses:  1. Unilateral primary osteoarthritis, right knee     Large Joint Inj: R knee on 08/31/2019 12:41 PM Indications: diagnostic evaluation, joint swelling and pain Details: 18 G 1.5 in needle, superolateral approach  Arthrogram: No  Medications: 5 mL lidocaine 1 %; 40 mg methylPREDNISolone acetate 40 MG/ML; 4 mL bupivacaine 0.25 % Aspirate: 5 mL Outcome: tolerated well, no immediate complications Procedure, treatment alternatives, risks and benefits explained, specific risks discussed. Consent was given by the patient. Immediately prior to procedure a time out was called to verify the correct patient, procedure, equipment, support staff and site/side marked as required. Patient was prepped and draped in the usual sterile fashion.

## 2019-09-09 ENCOUNTER — Ambulatory Visit
Admission: RE | Admit: 2019-09-09 | Discharge: 2019-09-09 | Disposition: A | Discharge status: Home / Self Care | Payer: 59 | Source: Ambulatory Visit | Attending: Internal Medicine | Admitting: Internal Medicine

## 2019-09-09 DIAGNOSIS — E041 Nontoxic single thyroid nodule: Secondary | ICD-10-CM

## 2019-09-24 MED ORDER — LIDOCAINE HCL 1 % IJ SOLN
5.0000 mL | INTRAMUSCULAR | Status: AC | PRN
  Administered 2019-08-31: 5 mL

## 2019-09-24 MED ORDER — BUPIVACAINE HCL 0.25 % IJ SOLN
4.0000 mL | INTRAMUSCULAR | Status: AC | PRN
Start: 2019-08-31 — End: 2019-08-31
  Administered 2019-08-31: 4 mL via INTRA_ARTICULAR

## 2019-09-24 MED ORDER — METHYLPREDNISOLONE ACETATE 40 MG/ML IJ SUSP
40.0000 mg | INTRAMUSCULAR | Status: AC | PRN
  Administered 2019-08-31: 40 mg via INTRA_ARTICULAR

## 2020-05-20 NOTE — Progress Notes (Signed)
New Patient Note  RE: Nathaniel Robertson MRN: [id number] DOB: 22-Apr-1960 Date of Office Visit: 05/21/2020  Referring provider: No ref. provider found Primary care provider: Georgianne Fick, MD  Chief Complaint: Pruritis  History of Present Illness: I had the pleasure of seeing Nathaniel Robertson for initial evaluation at the [facility] on 05/21/2020. He is a 60 y.o. male, who is self-referred here for the evaluation of itching and possible food allergies.  Patient was seen by Dr. Lucie Leather in 2017 for asthma, allergic rhino conjunctivitis, pruritus.  Itching: Itching/rash started a few years ago. This initially started on his abdominal area but it can occur anywhere now. Describes them as itchy and has some scars on the skin. The itching is daily and worse at night. Associated symptoms include: none. Suspected triggers are unknown but worse after shower. Denies any  fevers, chills, changes in medications, foods, personal care products or recent infections. He has tried the following therapies: Xyzal with some benefit.  Patient uses Jergen's to moisturize.  Previous work up includes: patient saw dermatology a few years ago and started on hydroxyzine with no benefit. No prior skin biopsy or patch testing.  Patient is up to date with the following cancer screening tests: PSA.  Rhinitis: He reports symptoms of rhinorrhea, sneezing, nasal congestion, itchy/watery eyes. Symptoms have been going on for many years. The symptoms are present mainly in the spring. He has used montelukast, Xyzal, Nasonex with some improvement in symptoms. Sinus infections: not recently. Previous work up includes:  2017 allergy skin prick testing was positive to trees, cockroach, grass, cat, dust mites. Patient was on allergy injections for about 2 years in [place] with some benefit. History of reflux: yes but does not take medications for this.  Food:  Past work up includes: 2017 skin prick testing  which showed negative to food panel.  Patient had eggs recently and noticed some nausea. Concerned about food allergies contributing to his symptoms.  Dietary History: patient has been eating other foods including milk, peanut, treenuts, sesame, shellfish, fish, soy, wheat, meats, fruits and vegetables.  Assessment and Plan: Nathaniel Robertson is a 60 y.o. male with: Pruritus Whole body pruritus for many years and now having some hyperpigmented excoriating changes on the skin.  Patient was seen by allergy in 2017 and dermatology in the past with no identifiable triggers.  Patient concerned if he is developing allergies contributing to his symptoms.  Today's skin testing showed: Positive to weed pollen, tree pollen, dust mites, cat, cockroach, dog and ragweed pollen, grass. Borderline positive to soy and almonds.   Discussed with patient that I am not sure how much of the above allergens are contributing to his symptoms.  Okay to continue to eat soy and almonds.  See below for proper skin care - use fragrance free and dye free products.  Moisturize 1-2 times a day with triamcinolone:Eucerin mix . For more than twice a day use the following: Aquaphor, Vaseline, Cerave, Cetaphil, Eucerin, Vanicream.  Itching: . Take Allegra 180mg  in the morning . Take Xyzal 5mg  in the evening.  . Get bloodwork to rule out other etiologies.  Other allergic rhinitis Rhinoconjunctivitis symptoms mainly in the spring.  2017 skin testing was positive to trees, cockroach, grass, cat and dust mites.  Patient was on allergy med therapy [place] for 2 years with some benefit.  Today's skin testing showed: Positive to weed pollen, tree pollen, dust mites, cat, cockroach, dog and ragweed pollen, grass.  Start environmental control measures as below.  May use over the counter antihistamines such as Zyrtec (cetirizine), Claritin (loratadine), Allegra (fexofenadine), or Xyzal (levocetirizine) daily as needed as above.    Continue Singulair (montelukast) 10mg  daily at night.  Read about allergy injections - handout given.   Allergic conjunctivitis of both eyes  See assessment and plan as above for allergic rhinitis.  Asthma Reports shortness of breath at times but much better with Singulair and Xyzal.  He also uses Symbicort 160 mcg as needed and has been using it more frequently the last few days.  Today's spirometry shows some restriction with 30% improvement in FEV1 post bronchodilator treatment.  Clinically feeling improved.  Daily controller medication(s): Continue Singulair (montelukast) 10mg  daily at night. START Airduo Digihaler 1 puff twice a day and rinse mouth after each use - sample given which should last for 1 month. Demonstrated proper use.   If it works well, will send in a prescription. Symbicort is over $150 for patient.  May use albuterol rescue inhaler 2 puffs every 4 to 6 hours as needed for shortness of breath, chest tightness, coughing, and wheezing. May use albuterol rescue inhaler 2 puffs 5 to 15 minutes prior to strenuous physical activities. Monitor frequency of use.  Repeat spirometry at next visit.  Return in about 4 weeks (around 06/18/2020).  Meds ordered this encounter  Medications  . triamcinolone ointment (KENALOG) 0.1 %    Sig: Apply 1 application topically 2 (two) times daily. Use as moisturizer    Dispense:  453 g    Refill:  1    Mix 1:1 with Eucerin.    Lab Orders     CBC with Differential/Platelet     Chronic Urticaria     Comprehensive metabolic panel     C-reactive protein     Thyroid Cascade Profile     Tryptase     Alpha-Gal Panel     ANA w/Reflex     Sedimentation rate  Other allergy screening: Asthma: yes  Patient has some shortness of breat at times but much better with Singulair and Xyzal.  Patient had to use Symbicort daily since stopped Xyzal and walking outdoors more.   Medication allergy: no Hymenoptera allergy: no History of  recurrent infections suggestive of immunodeficency: no  Diagnostics: Spirometry:  Tracings reviewed. His effort: Good reproducible efforts. FVC: 2.15L FEV1: 1.58L, 48% predicted FEV1/FVC ratio: 57% Interpretation: Spirometry consistent with possible restrictive disease with 30% improvement in FEV1 post bronchodilator treatment. Clinically feeling improved.  Please see scanned spirometry results for details.  Skin Testing: Environmental allergy panel and food panel Positive to weed pollen, tree pollen, dust mites, cat, cockroach, dog and ragweed pollen, grass.  Borderline positive to soy and almonds.  Results discussed with patient/family.  Airborne Adult Perc - 05/21/20 0901    Time Antigen Placed 0900    Allergen Manufacturer 06/20/2020    Location Back    Number of Test 59    1. Control-Buffer 50% Glycerol Negative    2. Control-Histamine 1 mg/ml 2+    3. Albumin saline Negative    4. Bahia Negative    5. 07/21/20 Negative    6. Johnson Negative    7. Kentucky Blue Negative    8. Meadow Fescue Negative    9. Perennial Rye Negative    10. Sweet Vernal Negative    11. Timothy Negative    12. Cocklebur Negative    13. Burweed Marshelder Negative    14. Ragweed,  short Negative    15. Ragweed, Giant Negative    16. Plantain,  English 2+    17. Lamb's Quarters Negative    18. Sheep Sorrell Negative    19. Rough Pigweed Negative    20. Marsh Elder, Rough Negative    21. Mugwort, Common Negative    22. Ash mix Negative    23. Birch mix 4+    24. Beech American 2+    25. Box, Elder 2+    26. Cedar, red 2+    27. Cottonwood, Guinea-Bissau Negative    28. Elm mix Negative    29. Hickory 4+    30. Maple mix Negative    31. Oak, [place] mix 3+    32. Pecan Pollen 3+    33. Pine mix Negative    34. Sycamore Eastern Negative    35. Walnut, Black Pollen 2+    36. Alternaria alternata Negative    37. Cladosporium Herbarum Negative    38. Aspergillus mix Negative    39. Penicillium mix  Negative    40. Bipolaris sorokiniana (Helminthosporium) Negative    41. Drechslera spicifera (Curvularia) Negative    42. Mucor plumbeus Negative    43. Fusarium moniliforme Negative    44. Aureobasidium pullulans (pullulara) Negative    45. Rhizopus oryzae Negative    46. Botrytis cinera Negative    47. Epicoccum nigrum Negative    48. Phoma betae Negative    49. Candida Albicans Negative    50. Trichophyton mentagrophytes Negative    51. Mite, D Farinae  5,000 AU/ml 2+    52. Mite, D Pteronyssinus  5,000 AU/ml 2+    53. Cat Hair 10,000 BAU/ml 2+    54.  Dog Epithelia Negative    55. Mixed Feathers Negative    56. Horse Epithelia Negative    57. Cockroach, German 2+    58. Mouse Negative    59. Tobacco Leaf Negative          Intradermal - 05/21/20 0900    Time Antigen Placed 8657    Allergen Manufacturer Waynette Buttery    Location Back    Number of Test 10    Control Negative    [place] 2+    Johnson Negative    7 Grass Negative    Ragweed mix 3+    Mold 1 Negative    Mold 2 Negative    Mold 3 Negative    Mold 4 Negative    Dog 2+          Food Adult Perc - 05/21/20 1000    Time Antigen Placed 0900    Allergen Manufacturer Greer    Location Back    Number of allergen test 72     Control-buffer 50% Glycerol Negative    Control-Histamine 1 mg/ml 2+    1. Peanut Negative    2. Soybean --   +/-   3. Wheat Negative    4. Sesame Negative    5. Milk, cow Negative    6. Egg White, Chicken Negative    7. Casein Negative    8. Shellfish Mix Negative    9. Fish Mix Negative    10. Cashew Negative    11. Pecan Food Negative    12. Walnut Food Negative    13. Almond Negative    14. Hazelnut --   +/-   16. Coconut Negative    17. Pistachio Negative    18. Catfish Negative  19. Bass Negative    20. Trout Negative    21. Tuna Negative    22. Salmon Negative    23. Flounder Negative    24. Codfish Negative    25. Shrimp Negative    26. Crab Negative    27. Lobster  Negative    28. Oyster Negative    29. Scallops Negative    30. Barley Negative    31. Oat  Negative    32. Rye  Negative    33. Hops Negative    34. Rice Negative    35. Cottonseed Negative    36. Saccharomyces Cerevisiae  Negative    37. Pork Negative    38. Malawiurkey Meat Negative    39. Chicken Meat Negative    40. Beef Negative    41. Lamb Negative    42. Tomato Negative    43. White Potato Negative    44. Sweet Potato Negative    45. Pea, Green/English Negative    46. Navy Bean Negative    47. Mushrooms Negative    48. Avocado Negative    49. Onion Negative    50. Cabbage Negative    51. Carrots Negative    52. Celery Negative    53. Corn Negative    54. Cucumber Negative    55. Grape (White seedless) Negative    56. Orange  Negative    57. Banana Negative    58. Apple Negative    59. Peach Negative    60. Strawberry Negative    61. Cantaloupe Negative    62. Watermelon Negative    63. Pineapple Negative    64. Chocolate/Cacao bean Negative    65. Karaya Gum Negative    66. Acacia (Arabic Gum) Negative    67. Cinnamon Negative    68. Nutmeg Negative    69. Ginger Negative    70. Garlic Negative    71. Pepper, black Negative    72. Mustard Negative           Past Medical History: Patient Active Problem List   Diagnosis Date Noted  . Other allergic rhinitis 05/21/2020  . Asthma 05/21/2020  . Pruritus 05/21/2020  . Rash and other nonspecific skin eruption 05/21/2020  . Allergic conjunctivitis of both eyes 05/21/2020  . HTN, goal below 130/80 11/12/2017  . Severe obstructive sleep apnea 11/12/2017  . Snoring 12/31/2014  . 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 Obesity (BMI 35.0-39.9 without comorbidity) 12/31/2014   Past Medical History:  Diagnosis Date  . Essential hypertension   . Hypertension   . Impaired fasting glucose   . Mild persistent asthma without complication   . Mixed hyperlipidemia   . Obstructive sleep apnea   . Rash   .  Snoring 12/31/2014   Past Surgical History: Past Surgical History:  Procedure Laterality Date  . COLONOSCOPY     Medication List:  Current Outpatient Medications  Medication Sig Dispense Refill  . albuterol (PROVENTIL HFA;VENTOLIN HFA) 108 (90 BASE) MCG/ACT inhaler Inhale 2 puffs into the lungs every 6 (six) hours as needed for wheezing or shortness of breath.    Marland Kitchen. amLODipine (NORVASC) 10 MG tablet TK 1 T PO QD  2  . atorvastatin (LIPITOR) 10 MG tablet TK 1 T PO  D. GEF LIPITOR  1  . Budesonide-Formoterol Fumarate (SYMBICORT IN) Inhale 2 puffs into the lungs as needed.    . Cholecalciferol (VITAMIN D-1000 MAX ST PO) Take by mouth.    .Marland Kitchen  diclofenac (VOLTAREN) 50 MG EC tablet Take 50 mg by mouth 2 (two) times daily.    Marland Kitchen EPINEPHrine 0.3 mg/0.3 mL IJ SOAJ injection Use as directed for life-threatening allergic reaction. 4 Device 2  . levocetirizine (XYZAL) 5 MG tablet Take 5 mg by mouth every evening.    Marland Kitchen losartan (COZAAR) 100 MG tablet Take 100 mg by mouth daily.  0  . metFORMIN (GLUCOPHAGE-XR) 500 MG 24 hr tablet Take 500 mg by mouth at bedtime.   0  . montelukast (SINGULAIR) 10 MG tablet Take 10 mg by mouth at bedtime. Reported on 06/23/2015    . Omega 3-6-9 Fatty Acids (OMEGA 3-6-9 COMPLEX PO) Take by mouth.    . triamcinolone ointment (KENALOG) 0.1 % Apply 1 application topically 2 (two) times daily. Use as moisturizer 453 g 1  . mometasone (NASONEX) 50 MCG/ACT nasal spray Place 2 sprays into the nose at bedtime as needed and may repeat dose one time if needed. (Patient not taking: Reported on 05/21/2020) 17 g 12   No current facility-administered medications for this visit.   Allergies: No Known Allergies Social History: Social History   Socioeconomic History  . Marital status: Married    Spouse name: [name]  . Number of children: Not on file  . Years of education: Not on file  . Highest education level: Not on file  Occupational History  . Occupation: travel agent  Tobacco Use   . Smoking status: Never Smoker  . Smokeless tobacco: Never Used  Vaping Use  . Vaping Use: Never used  Substance and Sexual Activity  . Alcohol use: No    Alcohol/week: 0.0 standard drinks    Comment: occasional  . Drug use: No  . Sexual activity: Not on file  Other Topics Concern  . Not on file  Social History Narrative   Drinks 1 cup of coffee in the morning.      Married with 3 children.   Social Determinants of Health   Financial Resource Strain: Not on file  Food Insecurity: Not on file  Transportation Needs: Not on file  Physical Activity: Not on file  Stress: Not on file  Social Connections: Not on file   Lives in a 61 year old house. Smoking: denies Occupation: travel Psychologist, counselling HistorySurveyor, minerals in the house: no Carpet in the family room: no Carpet in the bedroom: no Heating: heat pump Cooling: central Pet: no  Family History: Family History  Problem Relation Age of Onset  . Diabetes Mother   . Asthma Father    Review of Systems  Constitutional: Negative for appetite change, chills, fever and unexpected weight change.  HENT: Positive for congestion and rhinorrhea.   Eyes: Positive for itching.  Respiratory: Positive for shortness of breath. Negative for cough, chest tightness and wheezing.   Cardiovascular: Negative for chest pain.  Gastrointestinal: Negative for abdominal pain.  Genitourinary: Negative for difficulty urinating.  Skin: Positive for rash.  Allergic/Immunologic: Positive for environmental allergies.  Neurological: Negative for headaches.   Objective: BP 118/82   Pulse 60   Temp 98.8 F (37.1 C) (Temporal)   Resp 16   Ht 5\' 10"  (1.778 m)   Wt 247 lb (112 kg)   SpO2 97%   BMI 35.44 kg/m  Body mass index is 35.44 kg/m. Physical Exam Vitals and nursing note reviewed.  Constitutional:      Appearance: Normal appearance. He is well-developed.  HENT:     Head: Normocephalic and atraumatic.  Right Ear: Tympanic membrane and external ear normal.     Left Ear: Tympanic membrane and external ear normal.     Nose: Nose normal.     Mouth/Throat:     Mouth: Mucous membranes are moist.     Pharynx: Oropharynx is clear.  Eyes:     Conjunctiva/sclera: Conjunctivae normal.  Cardiovascular:     Rate and Rhythm: Normal rate and regular rhythm.     Heart sounds: Normal heart sounds. No murmur heard. No friction rub. No gallop.   Pulmonary:     Effort: Pulmonary effort is normal.     Breath sounds: Normal breath sounds. No wheezing, rhonchi or rales.  Abdominal:     Palpations: Abdomen is soft.  Musculoskeletal:     Cervical back: Neck supple.  Skin:    General: Skin is warm.     Findings: Rash present.     Comments: Circular hyperpigmented rash throughout the upper/lower extremities b/l, abdominal area and upper back area.   Neurological:     Mental Status: He is alert and oriented to person, place, and time.  Psychiatric:        Behavior: Behavior normal.    The plan was reviewed with the patient/family, and all questions/concerned were addressed.  It was my pleasure to see Jacaden today and participate in his care. Please feel free to contact me with any questions or concerns.  Sincerely,  Wyline Mood, DO Allergy & Immunology  [facility] office: [phone] [facility] office: [phone]

## 2020-05-21 ENCOUNTER — Encounter: Payer: Self-pay | Admitting: Allergy

## 2020-05-21 ENCOUNTER — Other Ambulatory Visit: Payer: Self-pay

## 2020-05-21 ENCOUNTER — Ambulatory Visit (INDEPENDENT_AMBULATORY_CARE_PROVIDER_SITE_OTHER): Payer: 59 | Admitting: Allergy

## 2020-05-21 VITALS — BP 118/82 | HR 60 | Temp 98.8°F | Resp 16 | Ht 70.0 in | Wt 247.0 lb

## 2020-05-21 DIAGNOSIS — R21 Rash and other nonspecific skin eruption: Secondary | ICD-10-CM

## 2020-05-21 DIAGNOSIS — J45909 Unspecified asthma, uncomplicated: Secondary | ICD-10-CM

## 2020-05-21 DIAGNOSIS — J3089 Other allergic rhinitis: Secondary | ICD-10-CM | POA: Diagnosis not present

## 2020-05-21 DIAGNOSIS — L299 Pruritus, unspecified: Secondary | ICD-10-CM

## 2020-05-21 DIAGNOSIS — H1013 Acute atopic conjunctivitis, bilateral: Secondary | ICD-10-CM

## 2020-05-21 MED ORDER — TRIAMCINOLONE ACETONIDE 0.1 % EX OINT: 1.0000 "application " | TOPICAL_OINTMENT | Freq: Two times a day (BID) | CUTANEOUS | 1 refills | Status: DC

## 2020-05-21 NOTE — Assessment & Plan Note (Signed)
Reports shortness of breath at times but much better with Singulair and Xyzal.  He also uses Symbicort 160 mcg as needed and has been using it more frequently the last few days.  Today's spirometry shows some restriction with 30% improvement in FEV1 post bronchodilator treatment.  Clinically feeling improved.  Daily controller medication(s): Continue Singulair (montelukast) 10mg  daily at night. START Airduo Digihaler 1 puff twice a day and rinse mouth after each use - sample given which should last for 1 month. Demonstrated proper use.   If it works well, will send in a prescription. Symbicort is over $150 for patient.  May use albuterol rescue inhaler 2 puffs every 4 to 6 hours as needed for shortness of breath, chest tightness, coughing, and wheezing. May use albuterol rescue inhaler 2 puffs 5 to 15 minutes prior to strenuous physical activities. Monitor frequency of use.  Repeat spirometry at next visit.

## 2020-05-21 NOTE — Assessment & Plan Note (Signed)
Rhinoconjunctivitis symptoms mainly in the spring.  2017 skin testing was positive to trees, cockroach, grass, cat and dust mites.  Patient was on allergy med therapy [place] for 2 years with some benefit.  Today's skin testing showed: Positive to weed pollen, tree pollen, dust mites, cat, cockroach, dog and ragweed pollen, grass.   Start environmental control measures as below.  May use over the counter antihistamines such as Zyrtec (cetirizine), Claritin (loratadine), Allegra (fexofenadine), or Xyzal (levocetirizine) daily as needed as above.   Continue Singulair (montelukast) 10mg  daily at night.  Read about allergy injections - handout given.

## 2020-05-21 NOTE — Assessment & Plan Note (Signed)
Whole body pruritus for many years and now having some hyperpigmented excoriating changes on the skin.  Patient was seen by allergy in 2017 and dermatology in the past with no identifiable triggers.  Patient concerned if he is developing allergies contributing to his symptoms.  Today's skin testing showed: Positive to weed pollen, tree pollen, dust mites, cat, cockroach, dog and ragweed pollen, grass. Borderline positive to soy and almonds.   Discussed with patient that I am not sure how much of the above allergens are contributing to his symptoms.  Okay to continue to eat soy and almonds.  See below for proper skin care - use fragrance free and dye free products.  Moisturize 1-2 times a day with triamcinolone:Eucerin mix . For more than twice a day use the following: Aquaphor, Vaseline, Cerave, Cetaphil, Eucerin, Vanicream.  Itching: . Take Allegra 180mg  in the morning . Take Xyzal 5mg  in the evening.  . Get bloodwork to rule out other etiologies.

## 2020-05-21 NOTE — Patient Instructions (Addendum)
Today's skin testing showed: Positive to weed pollen, tree pollen, dust mites, cat, cockroach, dog and ragweed pollen, grass.  Borderline positive to soy and almonds.   Skin:  Not sure what's causing the itching.   See below for proper skin care - use fragrance free and dye free products.  Moisturize 1-2 times a day with triamcinolone:Eucerin mix . For more than twice a day use the following: Aquaphor, Vaseline, Cerave, Cetaphil, Eucerin, Vanicream.  Itching: . Take Allegra 180mg  in the morning . Take Xyzal 5mg  in the evening.  . Get bloodwork:  o We are ordering labs, so please allow 1-2 weeks for the results to come back. o With the newly implemented Cures Act, the labs might be visible to you at the same time that they become visible to me. However, I will not address the results until all of the results are back, so please be patient.   Environmental allergies  Start environmental control measures as below.  May use over the counter antihistamines such as Zyrtec (cetirizine), Claritin (loratadine), Allegra (fexofenadine), or Xyzal (levocetirizine) daily as needed as above.   Read about allergy injections - handout given.   Breathing: Daily controller medication(s): Continue Singulair (montelukast) 10mg  daily at night. START Airduo Digihaler 1 puff twice a day and rinse mouth after each use - sample given which should last for 1 month. Demonstrated proper use.  May use albuterol rescue inhaler 2 puffs every 4 to 6 hours as needed for shortness of breath, chest tightness, coughing, and wheezing. May use albuterol rescue inhaler 2 puffs 5 to 15 minutes prior to strenuous physical activities. Monitor frequency of use.  Breathing control goals:  Full participation in all desired activities (may need albuterol before activity) Albuterol use two times or less a week on average (not counting use with activity) Cough interfering with sleep two times or less a month Oral steroids  no more than once a year No hospitalizations  Follow up in 1 months or sooner if needed.   Skin care recommendations  Bath time: . Always use lukewarm water. AVOID very hot or cold water. Keep bathing time to 5-10 minutes. . Do NOT use bubble bath. . Use a mild soap and use just enough to wash the dirty areas. . Do NOT scrub skin vigorously.  . After bathing, pat dry your skin with a towel. Do NOT rub or scrub the skin.  Moisturizers and prescriptions:  . ALWAYS apply moisturizers immediately after bathing (within 3 minutes). This helps to lock-in moisture. . Use the moisturizer several times a day over the whole body. summer moisturizers include: Aveeno, CeraVe, Cetaphil. winter moisturizers include: Aquaphor, Vaseline, Cerave, Cetaphil, Eucerin, Vanicream. . When using moisturizers along with medications, the moisturizer should be applied about one hour after applying the medication to prevent diluting effect of the medication or moisturize around where you applied the medications. When not using medications, the moisturizer can be continued twice daily as maintenance.  Laundry and clothing: . Avoid laundry products with added color or perfumes. . Use unscented hypo-allergenic laundry products such as Tide free, Cheer free & gentle, and All free and clear.  . If the skin still seems dry or sensitive, you can try double-rinsing the clothes. . Avoid tight or scratchy clothing such as wool. . Do not use fabric softeners or dyer sheets.  Reducing Pollen Exposure . Pollen seasons: trees (spring), grass (summer) and ragweed/weeds (fall). Peri Jefferson Keep windows closed in your home  and car to lower pollen exposure.  Lilian Kapur air conditioning in the bedroom and throughout the house if possible.  . Avoid going out in dry windy days - especially early morning. . Pollen counts are highest between 5 - 10 AM and on dry, hot and windy days.  . Save outside activities for late afternoon  or after a heavy rain, when pollen levels are lower.  . Avoid mowing of grass if you have grass pollen allergy. Marland Kitchen Be aware that pollen can also be transported indoors on people and pets.  . Dry your clothes in an automatic dryer rather than hanging them outside where they might collect pollen.  . Rinse hair and eyes before bedtime. Control of House Dust Mite Allergen . Dust mite allergens are a common trigger of allergy and asthma symptoms. While they can be found throughout the house, these microscopic creatures thrive in warm, humid environments such as bedding, upholstered furniture and carpeting. . Because so much time is spent in the bedroom, it is essential to reduce mite levels there.  . Encase pillows, mattresses, and box springs in special allergen-proof fabric covers or airtight, zippered plastic covers.  . Bedding should be washed weekly in hot water (130 F) and dried in a hot dryer. Allergen-proof covers are available for comforters and pillows that can't be regularly washed.  Reyes Ivan the allergy-proof covers every few months. Minimize clutter in the bedroom. Keep pets out of the bedroom.  Marland Kitchen Keep humidity less than 50% by using a dehumidifier or air conditioning. You can buy a humidity measuring device called a hygrometer to monitor this.  . If possible, replace carpets with hardwood, linoleum, or washable area rugs. If that's not possible, vacuum frequently with a vacuum that has a HEPA filter. . Remove all upholstered furniture and non-washable window drapes from the bedroom. . Remove all non-washable stuffed toys from the bedroom.  Wash stuffed toys weekly. Pet Allergen Avoidance: . Contrary to popular opinion, there are no "hypoallergenic" breeds of dogs or cats. That is because people are not allergic to an animal's hair, but to an allergen found in the animal's saliva, dander (dead skin flakes) or urine. Pet allergy symptoms typically occur within minutes. For some people, symptoms  can build up and become most severe 8 to 12 hours after contact with the animal. People with severe allergies can experience reactions in public places if dander has been transported on the pet owners' clothing. Marland Kitchen Keeping an animal outdoors is only a partial solution, since homes with pets in the yard still have higher concentrations of animal allergens. . Before getting a pet, ask your allergist to determine if you are allergic to animals. If your pet is already considered part of your family, try to minimize contact and keep the pet out of the bedroom and other rooms where you spend a great deal of time. . As with dust mites, vacuum carpets often or replace carpet with a hardwood floor, tile or linoleum. . High-efficiency particulate air (HEPA) cleaners can reduce allergen levels over time. . While dander and saliva are the source of cat and dog allergens, urine is the source of allergens from rabbits, hamsters, mice and [place] pigs; so ask a non-allergic family member to clean the animal's cage. . If you have a pet allergy, talk to your allergist about the potential for allergy immunotherapy (allergy shots). This strategy can often provide long-term relief.

## 2020-05-21 NOTE — Assessment & Plan Note (Signed)
   See assessment and plan as above for allergic rhinitis.  

## 2020-06-03 ENCOUNTER — Encounter: Payer: Self-pay | Admitting: Allergy

## 2020-06-03 LAB — COMPREHENSIVE METABOLIC PANEL
ALT: 25 IU/L (ref 0–44)
AST: 23 IU/L (ref 0–40)
Albumin/Globulin Ratio: 1.7 (ref 1.2–2.2)
Albumin: 4.7 g/dL (ref 3.8–4.9)
Alkaline Phosphatase: 73 IU/L (ref 44–121)
BUN/Creatinine Ratio: 11 (ref 10–24)
BUN: 14 mg/dL (ref 8–27)
Bilirubin Total: 0.4 mg/dL (ref 0.0–1.2)
CO2: 23 mmol/L (ref 20–29)
Calcium: 9.7 mg/dL (ref 8.6–10.2)
Chloride: 102 mmol/L (ref 96–106)
Creatinine, Ser: 1.31 mg/dL — ABNORMAL HIGH (ref 0.76–1.27)
Globulin, Total: 2.7 g/dL (ref 1.5–4.5)
Glucose: 95 mg/dL (ref 65–99)
Potassium: 4.6 mmol/L (ref 3.5–5.2)
Sodium: 141 mmol/L (ref 134–144)
Total Protein: 7.4 g/dL (ref 6.0–8.5)
eGFR: 62 mL/min/{1.73_m2} (ref >59)

## 2020-06-03 LAB — CHRONIC URTICARIA: cu index: <4.5 (ref <10)

## 2020-06-03 LAB — ANA W/REFLEX: Anti Nuclear Antibody (ANA): NEGATIVE

## 2020-06-03 LAB — CBC WITH DIFFERENTIAL/PLATELET
Basophils Absolute: 0 10*3/uL (ref 0.0–0.2)
Basos: 1 %
EOS (ABSOLUTE): 0.2 10*3/uL (ref 0.0–0.4)
Eos: 2 %
Hematocrit: 42.3 % (ref 37.5–51.0)
Hemoglobin: 13.6 g/dL (ref 13.0–17.7)
Immature Grans (Abs): 0 10*3/uL (ref 0.0–0.1)
Immature Granulocytes: 0 %
Lymphocytes Absolute: 2.7 10*3/uL (ref 0.7–3.1)
Lymphs: 34 %
MCH: 26.5 pg — ABNORMAL LOW (ref 26.6–33.0)
MCHC: 32.2 g/dL (ref 31.5–35.7)
MCV: 83 fL (ref 79–97)
Monocytes Absolute: 0.5 10*3/uL (ref 0.1–0.9)
Monocytes: 6 %
Neutrophils Absolute: 4.6 10*3/uL (ref 1.4–7.0)
Neutrophils: 57 %
Platelets: 267 10*3/uL (ref 150–450)
RBC: 5.13 x10E6/uL (ref 4.14–5.80)
RDW: 14.4 % (ref 11.6–15.4)
WBC: 8.1 10*3/uL (ref 3.4–10.8)

## 2020-06-03 LAB — TRYPTASE: Tryptase: 5.6 ug/L (ref 2.2–13.2)

## 2020-06-03 LAB — ALPHA-GAL PANEL
Allergen Lamb IgE: <0.1 kU/L
Beef IgE: <0.1 kU/L
IgE (Immunoglobulin E), Serum: 500 IU/mL — ABNORMAL HIGH (ref 6–495)
O215-IgE Alpha-Gal: <0.1 kU/L
Pork IgE: <0.1 kU/L

## 2020-06-03 LAB — THYROID CASCADE PROFILE: TSH: 1.65 u[IU]/mL (ref 0.450–4.500)

## 2020-06-03 LAB — SEDIMENTATION RATE: Sed Rate: 5 mm/hr (ref 0–30)

## 2020-06-03 LAB — C-REACTIVE PROTEIN: CRP: <1 mg/L (ref 0–10)

## 2020-06-18 ENCOUNTER — Encounter: Payer: Self-pay | Admitting: Allergy

## 2020-06-18 ENCOUNTER — Other Ambulatory Visit: Payer: Self-pay

## 2020-06-18 ENCOUNTER — Ambulatory Visit (INDEPENDENT_AMBULATORY_CARE_PROVIDER_SITE_OTHER): Payer: 59 | Admitting: Allergy

## 2020-06-18 VITALS — BP 120/78 | HR 75 | Resp 17

## 2020-06-18 DIAGNOSIS — J45909 Unspecified asthma, uncomplicated: Secondary | ICD-10-CM

## 2020-06-18 DIAGNOSIS — H1013 Acute atopic conjunctivitis, bilateral: Secondary | ICD-10-CM

## 2020-06-18 DIAGNOSIS — L299 Pruritus, unspecified: Secondary | ICD-10-CM

## 2020-06-18 DIAGNOSIS — J3089 Other allergic rhinitis: Secondary | ICD-10-CM

## 2020-06-18 DIAGNOSIS — J4551 Severe persistent asthma with (acute) exacerbation: Secondary | ICD-10-CM

## 2020-06-18 DIAGNOSIS — J302 Other seasonal allergic rhinitis: Secondary | ICD-10-CM

## 2020-06-18 DIAGNOSIS — H1010 Acute atopic conjunctivitis, unspecified eye: Secondary | ICD-10-CM | POA: Insufficient documentation

## 2020-06-18 MED ORDER — AIRDUO DIGIHALER 232-14 MCG/ACT IN AEPB: 1.0000 | INHALATION_SPRAY | Freq: Two times a day (BID) | RESPIRATORY_TRACT | 5 refills | Status: DC

## 2020-06-18 MED ORDER — FAMOTIDINE 20 MG PO TABS
20.0000 mg | ORAL_TABLET | Freq: Two times a day (BID) | ORAL | 5 refills | Status: DC
Start: 2020-06-18 — End: 2020-07-07

## 2020-06-18 NOTE — Progress Notes (Signed)
Follow Up Note  RE: Nathaniel Robertson MRN: [id number] DOB: [birth year] Date of Office Visit: 06/18/2020  Referring provider: Georgianne Fick, MD Primary care provider: Georgianne Fick, MD  Chief Complaint: Pruritus  History of Present Illness: I had the pleasure of seeing Nathaniel Robertson for a follow up visit at the [facility] on 06/18/2020. He is a 60 y.o. male, who is being followed for pruritus, allergic rhino conjunctivitis, asthma, . His previous allergy office visit was on 05/21/2020 with Dr. [name]. Today is a regular follow up visit.  Pruritus Itching is better but still itchy daily and having some new areas of skin changes.  Currently taking allegra 180 in the morning and Xyzal in the evening.  Allergic rhino conjunctivitis Still taking montelukast at night and the antihistamines above with good benefit.   Asthma ACT score 19.  Noticed improvement with Airduo. Still using sample. Needs Rx sent in.  Assessment and Plan: Armour is a 60 y.o. male with: Pruritus Past history - Whole body pruritus for many years and now having some hyperpigmented excoriating changes on the skin.  Patient was seen by allergy in 2017 and dermatology in the past with no identifiable triggers.  2022 skin testing showed: Positive to weed pollen, tree pollen, dust mites, cat, cockroach, dog and ragweed pollen, grass. Borderline positive to soy and almonds.  Interim history - bloodwork unremarkable. Still itching daily and slightly improved.   Not sure what's causing the itching.   Start to avoid soy and almonds until the next visit.   See below for proper skin care - use fragrance free and dye free products.  Moisturize 1-2 times a day with triamcinolone:Eucerin mix . For more than twice a day use the following: Aquaphor, Vaseline, Cerave, Cetaphil, Eucerin, Vanicream.  Itching: . Take Allegra 180mg  in the morning . Take Xyzal 5mg  in the evening.  START famotidine 20mg   twice a day.   Will consider starting Dupixent injections for the pruritus. It may get approved based on his asthma.   Seasonal and perennial allergic rhinoconjunctivitis Past history - Rhinoconjunctivitis symptoms mainly in the spring.  2017 skin testing was positive to trees, cockroach, grass, cat and dust mites.  Patient was on allergy med therapy New for 2 years with some benefit. 2022 skin testing showed: Positive to weed pollen, tree pollen, dust mites, cat, cockroach, dog and ragweed pollen, grass.  Interim history - stable.   Continue environmental control measures as below.  May use over the counter antihistamines such as Zyrtec (cetirizine), Claritin (loratadine), Allegra (fexofenadine), or Xyzal (levocetirizine) daily as needed as above.   Consider allergy immunotherapy in the future.   Asthma Past history - Reports shortness of breath at times but much better with Singulair and Xyzal.  He also uses Symbicort 160 mcg as needed and has been using it more frequently the last few days. 2022 spirometry shows some restriction with 30% improvement in FEV1 post bronchodilator treatment.  Clinically feeling improved. Interim history - Better with Airduo.  ACT score 19.   Today's spirometry showed mixed obstruction and restriction - improved from previous. Daily controller medication(s): Continue Singulair (montelukast) 10mg  daily at night. Continue Airduo Digihaler [place] 1 puff twice a day and rinse mouth after each use. May use albuterol rescue inhaler 2 puffs every 4 to 6 hours as needed for shortness of breath, chest tightness, coughing, and wheezing. May use albuterol rescue inhaler 2 puffs 5 to 15 minutes prior to strenuous physical activities.  Monitor frequency of use.  Repeat spirometry at next visit. Consider starting Dupixent to control asthma and pruritus/rash.  Return in about 4 weeks (around 07/16/2020).  Meds ordered this encounter  Medications  . famotidine  (PEPCID) 20 MG tablet    Sig: Take 1 tablet (20 mg total) by mouth 2 (two) times daily.    Dispense:  60 tablet    Refill:  5  . Fluticasone-Salmeterol,sensor, (AIRDUO DIGIHALER) 232-14 MCG/ACT AEPB    Sig: Inhale 1 puff into the lungs in the morning and at bedtime. Rinse mouth after each use.    Dispense:  1 each    Refill:  5    Patient bringing coupon.   Lab Orders  No laboratory test(s) ordered today    Diagnostics: Spirometry:  Tracings reviewed. His effort: Good reproducible efforts. FVC: 3.47L FEV1: 2.34L, 65% predicted FEV1/FVC ratio: 67% Interpretation: Spirometry consistent with mixed obstructive and restrictive disease.  Please see scanned spirometry results for details.  Medication List:  Current Outpatient Medications  Medication Sig Dispense Refill  . albuterol (PROVENTIL HFA;VENTOLIN HFA) 108 (90 BASE) MCG/ACT inhaler Inhale 2 puffs into the lungs every 6 (six) hours as needed for wheezing or shortness of breath.    Marland Kitchen amLODipine (NORVASC) 10 MG tablet TK 1 T PO QD  2  . atorvastatin (LIPITOR) 10 MG tablet TK 1 T PO  D. GEF LIPITOR  1  . Cholecalciferol (VITAMIN D-1000 MAX ST PO) Take by mouth.    . famotidine (PEPCID) 20 MG tablet Take 1 tablet (20 mg total) by mouth 2 (two) times daily. 60 tablet 5  . Fluticasone-Salmeterol,sensor, (AIRDUO DIGIHALER) 232-14 MCG/ACT AEPB Inhale 1 puff into the lungs in the morning and at bedtime. Rinse mouth after each use. 1 each 5  . levocetirizine (XYZAL) 5 MG tablet Take 5 mg by mouth every evening.    Marland Kitchen losartan (COZAAR) 100 MG tablet Take 100 mg by mouth daily.  0  . metFORMIN (GLUCOPHAGE-XR) 500 MG 24 hr tablet Take 500 mg by mouth at bedtime.   0  . montelukast (SINGULAIR) 10 MG tablet Take 10 mg by mouth at bedtime. Reported on 06/23/2015    . Omega 3-6-9 Fatty Acids (OMEGA 3-6-9 COMPLEX PO) Take by mouth.    . triamcinolone ointment (KENALOG) 0.1 % Apply 1 application topically 2 (two) times daily. Use as moisturizer 453 g  1  . diclofenac (VOLTAREN) 50 MG EC tablet Take 50 mg by mouth 2 (two) times daily. (Patient not taking: Reported on 06/18/2020)    . EPINEPHrine 0.3 mg/0.3 mL IJ SOAJ injection Use as directed for life-threatening allergic reaction. (Patient not taking: Reported on 06/18/2020) 4 Device 2   No current facility-administered medications for this visit.   Allergies: No Known Allergies I reviewed his past medical history, social history, family history, and environmental history and no significant changes have been reported from his previous visit.  Review of Systems  Constitutional: Negative for appetite change, chills, fever and unexpected weight change.  HENT: Negative for congestion and rhinorrhea.   Eyes: Negative for itching.  Respiratory: Negative for cough, chest tightness, shortness of breath and wheezing.   Cardiovascular: Negative for chest pain.  Gastrointestinal: Negative for abdominal pain.  Genitourinary: Negative for difficulty urinating.  Skin: Positive for rash.  Allergic/Immunologic: Positive for environmental allergies.  Neurological: Negative for headaches.   Objective: BP 120/78   Pulse 75   Resp 17   SpO2 97%  There is no height or weight on  file to calculate BMI. Physical Exam Vitals and nursing note reviewed.  Constitutional:      Appearance: Normal appearance. He is well-developed.  HENT:     Head: Normocephalic and atraumatic.     Right Ear: Tympanic membrane and external ear normal.     Left Ear: Tympanic membrane and external ear normal.     Nose: Nose normal.     Mouth/Throat:     Mouth: Mucous membranes are moist.     Pharynx: Oropharynx is clear.  Eyes:     Conjunctiva/sclera: Conjunctivae normal.  Cardiovascular:     Rate and Rhythm: Normal rate and regular rhythm.     Heart sounds: Normal heart sounds. No murmur heard. No friction rub. No gallop.   Pulmonary:     Effort: Pulmonary effort is normal.     Breath sounds: Normal breath sounds. No  wheezing, rhonchi or rales.  Musculoskeletal:     Cervical back: Neck supple.  Skin:    General: Skin is warm.     Findings: Rash present.     Comments: Circular hyperpigmented rash throughout the upper/lower extremities b/l, abdominal area and upper back area with excoriation marks.   Neurological:     Mental Status: He is alert and oriented to person, place, and time.  Psychiatric:        Behavior: Behavior normal.    Previous notes and tests were reviewed. The plan was reviewed with the patient/family, and all questions/concerned were addressed.  It was my pleasure to see Kalid today and participate in his care. Please feel free to contact me with any questions or concerns.  Sincerely,  Wyline Mood, DO Allergy & Immunology  [facility] office: [phone] [facility] office: [phone]

## 2020-06-18 NOTE — Assessment & Plan Note (Signed)
Past history - Whole body pruritus for many years and now having some hyperpigmented excoriating changes on the skin.  Patient was seen by allergy in 2017 and dermatology in the past with no identifiable triggers.  2022 skin testing showed: Positive to weed pollen, tree pollen, dust mites, cat, cockroach, dog and ragweed pollen, grass. Borderline positive to soy and almonds.  Interim history - bloodwork unremarkable. Still itching daily and slightly improved.   Not sure what's causing the itching.   Start to avoid soy and almonds until the next visit.   See below for proper skin care - use fragrance free and dye free products.  Moisturize 1-2 times a day with triamcinolone:Eucerin mix . For more than twice a day use the following: Aquaphor, Vaseline, Cerave, Cetaphil, Eucerin, Vanicream.  Itching: . Take Allegra 180mg  in the morning . Take Xyzal 5mg  in the evening.  START famotidine 20mg  twice a day.   Will consider starting Dupixent injections for the pruritus. It may get approved based on his asthma.

## 2020-06-18 NOTE — Patient Instructions (Addendum)
Skin:  Not sure what's causing the itching.   AVOID SOY and ALMONDS until the next visit.  See below for proper skin care - use fragrance free and dye free products.  Moisturize 1-2 times a day with triamcinolone:Eucerin mix . For more than twice a day use the following: Aquaphor, Vaseline, Cerave, Cetaphil, Eucerin, Vanicream.  Itching: . Take Allegra 180mg  in the morning . Take Xyzal 5mg  in the evening.  START famotidine 20mg  twice a day.   Read about Dupixent injections - this is usually for eczema but it may help with the itching.  It is also used for asthma.  Environmental allergies  2022 skin testing positive to weed pollen, tree pollen, dust mites, cat, cockroach, dog and ragweed pollen, grass.   Continue environmental control measures as below.  May use over the counter antihistamines such as Zyrtec (cetirizine), Claritin (loratadine), Allegra (fexofenadine), or Xyzal (levocetirizine) daily as needed as above.   Read about allergy injections - handout given.   Breathing: Daily controller medication(s): Continue Singulair (montelukast) 10mg  daily at night. Continue Airduo Digihaler 1 puff twice a day and rinse mouth after each use. May use albuterol rescue inhaler 2 puffs every 4 to 6 hours as needed for shortness of breath, chest tightness, coughing, and wheezing. May use albuterol rescue inhaler 2 puffs 5 to 15 minutes prior to strenuous physical activities. Monitor frequency of use.  Breathing control goals:  Full participation in all desired activities (may need albuterol before activity) Albuterol use two times or less a week on average (not counting use with activity) Cough interfering with sleep two times or less a month Oral steroids no more than once a year No hospitalizations  Follow up in 1 months or sooner if needed.   Skin care recommendations  Bath time: . Always use lukewarm water. AVOID very hot or cold water. Keep bathing time to 5-10  minutes. . Do NOT use bubble bath. . Use a mild soap and use just enough to wash the dirty areas. . Do NOT scrub skin vigorously.  . After bathing, pat dry your skin with a towel. Do NOT rub or scrub the skin.  Moisturizers and prescriptions:  . ALWAYS apply moisturizers immediately after bathing (within 3 minutes). This helps to lock-in moisture. . Use the moisturizer several times a day over the whole body. 2023 summer moisturizers include: Aveeno, CeraVe, Cetaphil. winter moisturizers include: Aquaphor, Vaseline, Cerave, Cetaphil, Eucerin, Vanicream. . When using moisturizers along with medications, the moisturizer should be applied about one hour after applying the medication to prevent diluting effect of the medication or moisturize around where you applied the medications. When not using medications, the moisturizer can be continued twice daily as maintenance.  Laundry and clothing: . Avoid laundry products with added color or perfumes. . Use unscented hypo-allergenic laundry products such as Tide free, Cheer free & gentle, and All free and clear.  . If the skin still seems dry or sensitive, you can try double-rinsing the clothes. . Avoid tight or scratchy clothing such as wool. . Do not use fabric softeners or dyer sheets.  Reducing Pollen Exposure . Pollen seasons: trees (spring), grass (summer) and ragweed/weeds (fall). Peri Jefferson Keep windows closed in your home and car to lower pollen exposure.  Peri Jefferson air conditioning in the bedroom and throughout the house if possible.  . Avoid going out in dry windy days - especially early morning. . Pollen counts are highest between 5 - 10 AM  and on dry, hot and windy days.  . Save outside activities for late afternoon or after a heavy rain, when pollen levels are lower.  . Avoid mowing of grass if you have grass pollen allergy. Marland Kitchen Be aware that pollen can also be transported indoors on people and pets.  . Dry your clothes in an  automatic dryer rather than hanging them outside where they might collect pollen.  . Rinse hair and eyes before bedtime. Control of House Dust Mite Allergen . Dust mite allergens are a common trigger of allergy and asthma symptoms. While they can be found throughout the house, these microscopic creatures thrive in warm, humid environments such as bedding, upholstered furniture and carpeting. . Because so much time is spent in the bedroom, it is essential to reduce mite levels there.  . Encase pillows, mattresses, and box springs in special allergen-proof fabric covers or airtight, zippered plastic covers.  . Bedding should be washed weekly in hot water (130 F) and dried in a hot dryer. Allergen-proof covers are available for comforters and pillows that can't be regularly washed.  Reyes Ivan the allergy-proof covers every few months. Minimize clutter in the bedroom. Keep pets out of the bedroom.  Marland Kitchen Keep humidity less than 50% by using a dehumidifier or air conditioning. You can buy a humidity measuring device called a hygrometer to monitor this.  . If possible, replace carpets with hardwood, linoleum, or washable area rugs. If that's not possible, vacuum frequently with a vacuum that has a HEPA filter. . Remove all upholstered furniture and non-washable window drapes from the bedroom. . Remove all non-washable stuffed toys from the bedroom.  Wash stuffed toys weekly. Pet Allergen Avoidance: . Contrary to popular opinion, there are no "hypoallergenic" breeds of dogs or cats. That is because people are not allergic to an animal's hair, but to an allergen found in the animal's saliva, dander (dead skin flakes) or urine. Pet allergy symptoms typically occur within minutes. For some people, symptoms can build up and become most severe 8 to 12 hours after contact with the animal. People with severe allergies can experience reactions in public places if dander has been transported on the pet owners'  clothing. Marland Kitchen Keeping an animal outdoors is only a partial solution, since homes with pets in the yard still have higher concentrations of animal allergens. . Before getting a pet, ask your allergist to determine if you are allergic to animals. If your pet is already considered part of your family, try to minimize contact and keep the pet out of the bedroom and other rooms where you spend a great deal of time. . As with dust mites, vacuum carpets often or replace carpet with a hardwood floor, tile or linoleum. . High-efficiency particulate air (HEPA) cleaners can reduce allergen levels over time. . While dander and saliva are the source of cat and dog allergens, urine is the source of allergens from rabbits, hamsters, mice and [place] pigs; so ask a non-allergic family member to clean the animal's cage. . If you have a pet allergy, talk to your allergist about the potential for allergy immunotherapy (allergy shots). This strategy can often provide long-term relief.

## 2020-06-18 NOTE — Assessment & Plan Note (Signed)
Past history - Reports shortness of breath at times but much better with Singulair and Xyzal.  He also uses Symbicort 160 mcg as needed and has been using it more frequently the last few days. 2022 spirometry shows some restriction with 30% improvement in FEV1 post bronchodilator treatment.  Clinically feeling improved. Interim history - Better with Airduo.  ACT score 19.   Today's spirometry showed mixed obstruction and restriction - improved from previous. Daily controller medication(s): Continue Singulair (montelukast) 10mg  daily at night. Continue Airduo Digihaler 1 puff twice a day and rinse mouth after each use. May use albuterol rescue inhaler 2 puffs every 4 to 6 hours as needed for shortness of breath, chest tightness, coughing, and wheezing. May use albuterol rescue inhaler 2 puffs 5 to 15 minutes prior to strenuous physical activities. Monitor frequency of use.  Repeat spirometry at next visit. Consider starting Dupixent to control asthma and pruritus/rash.

## 2020-06-18 NOTE — Assessment & Plan Note (Signed)
Past history - Rhinoconjunctivitis symptoms mainly in the spring.  2017 skin testing was positive to trees, cockroach, grass, cat and dust mites.  Patient was on allergy med therapy [place] for 2 years with some benefit. 2022 skin testing showed: Positive to weed pollen, tree pollen, dust mites, cat, cockroach, dog and ragweed pollen, grass.  Interim history - stable.   Continue environmental control measures as below.  May use over the counter antihistamines such as Zyrtec (cetirizine), Claritin (loratadine), Allegra (fexofenadine), or Xyzal (levocetirizine) daily as needed as above.   Consider allergy immunotherapy in the future.

## 2020-07-07 ENCOUNTER — Ambulatory Visit (INDEPENDENT_AMBULATORY_CARE_PROVIDER_SITE_OTHER): Payer: 59 | Admitting: Allergy

## 2020-07-07 ENCOUNTER — Encounter: Payer: Self-pay | Admitting: Allergy

## 2020-07-07 ENCOUNTER — Other Ambulatory Visit: Payer: Self-pay

## 2020-07-07 VITALS — BP 112/88 | HR 88 | Temp 97.4°F | Resp 18 | Ht 70.0 in | Wt 247.1 lb

## 2020-07-07 DIAGNOSIS — H1013 Acute atopic conjunctivitis, bilateral: Secondary | ICD-10-CM

## 2020-07-07 DIAGNOSIS — J3089 Other allergic rhinitis: Secondary | ICD-10-CM

## 2020-07-07 DIAGNOSIS — J4550 Severe persistent asthma, uncomplicated: Secondary | ICD-10-CM

## 2020-07-07 DIAGNOSIS — J302 Other seasonal allergic rhinitis: Secondary | ICD-10-CM

## 2020-07-07 DIAGNOSIS — H1010 Acute atopic conjunctivitis, unspecified eye: Secondary | ICD-10-CM

## 2020-07-07 DIAGNOSIS — L299 Pruritus, unspecified: Secondary | ICD-10-CM | POA: Diagnosis not present

## 2020-07-07 MED ORDER — DUPILUMAB 300 MG/2ML ~~LOC~~ SOSY: 300.0000 mg | PREFILLED_SYRINGE | Freq: Once | SUBCUTANEOUS | Status: DC

## 2020-07-07 MED ORDER — EPINEPHRINE 0.3 MG/0.3ML IJ SOAJ: INTRAMUSCULAR | 2 refills | Status: AC

## 2020-07-07 NOTE — Progress Notes (Signed)
Follow Up Note  RE: Nathaniel Robertson MRN: [id number] DOB: [birth year] Date of Office Visit: 07/07/2020  Referring provider: Georgianne Fick, MD Primary care provider: Georgianne Fick, MD  Chief Complaint: Asthma  History of Present Illness: I had the pleasure of seeing Nathaniel Robertson for a follow up visit at the [facility] on 07/07/2020. He is a 60 y.o. male, who is being followed for pruritus, allergic rhinoconjunctivitis and asthma. His previous allergy office visit was on 06/18/2020 with Dr. [name]. Today is a regular follow up visit.  Pruritus Itching is about the same. The famotidine did not help the itching/rash. It did help his heartburn symptoms though. He also tried eliminating soy and almonds with no benefit.  Interested in starting Dupixent injections.   No recent skin biopsy.   Seasonal and perennial allergic rhinoconjunctivitis Increased symptoms the past week and started on Flonase.   Asthma ACT score 20.  Taking Airduo 1 puff twice a day and taking Singulair daily. Issues with refills with Airduo at the pharmacy.  No albuterol use the last 3 weeks.  Denies any SOB, coughing, wheezing, chest tightness, nocturnal awakenings, ER/urgent care visits or prednisone use since the last visit.  Going to [place] this week and then going to [place] later.  Assessment and Plan: Nathaniel Robertson is a 60 y.o. male with: Pruritus Past history - Whole body pruritus for many years and now having some hyperpigmented excoriating changes on the skin.  Patient was seen by allergy in 2017 and dermatology in the past with no identifiable triggers.  2022 skin testing showed: Positive to weed pollen, tree pollen, dust mites, cat, cockroach, dog and ragweed pollen, grass. Borderline positive to soy and almonds.  Interim history - unchanged. No benefit with adding Pepcid or removing soy/almonds from diet.    Not sure what's causing the itching.  Reintroduce soy and  almond back into diet slowly.  See below for proper skin care - use fragrance free and dye free products.  Moisturize 1-2 times a day with triamcinolone:Eucerin mix . For more than twice a day use the following: Aquaphor, Vaseline, Cerave, Cetaphil, Eucerin, Vanicream.  Itching: . Take Allegra 180mg  in the morning . Take Xyzal 5mg  in the evening.  to take famotidine as needed for heartburn and not for itching  Dupixent 600mg  loading dose sample given today. Continue 300mg  every 2 weeks at home to see if it helps his skin issues. Also starting Dupixent for asthma as below.   If no improvement, will refer back to dermatology.   Asthma Past history - Reports shortness of breath at times but much better with Singulair and Xyzal.  He also uses Symbicort 160 mcg as needed and has been using it more frequently the last few days. 2022 spirometry shows some restriction with 30% improvement in FEV1 post bronchodilator treatment.  Clinically feeling improved. Interim history - stable.   ACT score 20.   Today's spirometry showed restriction. Daily controller medication(s): Continue Singulair (montelukast) 10mg  daily at night. Continue Airduo Digihaler Molli Knock 1 puff twice a day and rinse mouth after each use. Sample given.  START Dupixent injections every 2 weeks at home. Sample dose given in office today. May use albuterol rescue inhaler 2 puffs every 4 to 6 hours as needed for shortness of breath, chest tightness, coughing, and wheezing. May use albuterol rescue inhaler 2 puffs 5 to 15 minutes prior to strenuous physical activities. Monitor frequency of use.  Repeat spirometry at  next visit.   Seasonal and perennial allergic rhinoconjunctivitis Past history - Rhinoconjunctivitis symptoms mainly in the spring.  2017 skin testing was positive to trees, cockroach, grass, cat and dust mites.  Patient was on allergy med therapy [place] for 2 years with some benefit. 2022 skin testing showed:  Positive to weed pollen, tree pollen, dust mites, cat, cockroach, dog and ragweed pollen, grass.  Interim history - worsening symptoms the past few weeks.   Continue environmental control measures as below.  May use over the counter antihistamines such as Zyrtec (cetirizine), Claritin (loratadine), Allegra (fexofenadine), or Xyzal (levocetirizine) daily as needed as above.   May use Flonase (fluticasone) nasal spray 1 spray per nostril twice a day as needed for nasal congestion.   Nasal saline spray (i.e., Simply Saline) or nasal saline lavage (i.e., NeilMed) is recommended as needed and prior to medicated nasal sprays.  Will consider allergy injections in the future once pruritus and asthma is more stable.  Return in about 4 weeks (around 08/04/2020).  Meds ordered this encounter  Medications  . dupilumab (DUPIXENT) prefilled syringe 300 mg  . EPINEPHrine 0.3 mg/0.3 mL IJ SOAJ injection    Sig: Use as directed for life-threatening allergic reaction.    Dispense:  1 each    Refill:  2    May dispense generic/Mylan/Teva brand.   Lab Orders  No laboratory test(s) ordered today    Diagnostics: Spirometry:  Tracings reviewed. His effort: Good reproducible efforts. FVC: 2.54L FEV1: 1.79L, 57% predicted FEV1/FVC ratio: 70% Interpretation: Spirometry consistent with possible restrictive disease.  Please see scanned spirometry results for details.  Medication List:  Current Outpatient Medications  Medication Sig Dispense Refill  . albuterol (PROVENTIL HFA;VENTOLIN HFA) 108 (90 BASE) MCG/ACT inhaler Inhale 2 puffs into the lungs every 6 (six) hours as needed for wheezing or shortness of breath.    Marland Kitchen amLODipine (NORVASC) 10 MG tablet TK 1 T PO QD  2  . atorvastatin (LIPITOR) 10 MG tablet TK 1 T PO  D. GEF LIPITOR  1  . Cholecalciferol (VITAMIN D-1000 MAX ST PO) Take by mouth.    . Fluticasone-Salmeterol,sensor, (AIRDUO DIGIHALER) 232-14 MCG/ACT AEPB Inhale 1 puff into the lungs in  the morning and at bedtime. Rinse mouth after each use. 1 each 5  . levocetirizine (XYZAL) 5 MG tablet Take 5 mg by mouth every evening.    Marland Kitchen losartan (COZAAR) 100 MG tablet Take 100 mg by mouth daily.  0  . metFORMIN (GLUCOPHAGE-XR) 500 MG 24 hr tablet Take 500 mg by mouth at bedtime.   0  . montelukast (SINGULAIR) 10 MG tablet Take 10 mg by mouth at bedtime. Reported on 06/23/2015    . Omega 3-6-9 Fatty Acids (OMEGA 3-6-9 COMPLEX PO) Take by mouth.    . triamcinolone ointment (KENALOG) 0.1 % Apply 1 application topically 2 (two) times daily. Use as moisturizer 453 g 1  . diclofenac (VOLTAREN) 50 MG EC tablet Take 50 mg by mouth 2 (two) times daily. (Patient not taking: No sig reported)    . EPINEPHrine 0.3 mg/0.3 mL IJ SOAJ injection Use as directed for life-threatening allergic reaction. 1 each 2   Current Facility-Administered Medications  Medication Dose Route Frequency Provider Last Rate Last Admin  . dupilumab (DUPIXENT) prefilled syringe 300 mg  300 mg Subcutaneous Once Ellamae Sia, DO       Allergies: No Known Allergies I reviewed his past medical history, social history, family history, and environmental history and no significant  changes have been reported from his previous visit.  Review of Systems  Constitutional: Negative for appetite change, chills, fever and unexpected weight change.  HENT: Negative for congestion and rhinorrhea.   Eyes: Negative for itching.  Respiratory: Negative for cough, chest tightness, shortness of breath and wheezing.   Cardiovascular: Negative for chest pain.  Gastrointestinal: Negative for abdominal pain.  Genitourinary: Negative for difficulty urinating.  Skin: Positive for rash.  Allergic/Immunologic: Positive for environmental allergies.  Neurological: Negative for headaches.   Objective: BP 112/88 (BP Location: Left Arm, Patient Position: Sitting, Cuff Size: Large)   Pulse 88   Temp (!) 97.4 F (36.3 C) (Temporal)   Resp 18   Ht 5\' 10"   (1.778 m)   Wt 247 lb 1.6 oz (112.1 kg)   SpO2 97%   BMI 35.46 kg/m  Body mass index is 35.46 kg/m. Physical Exam Vitals and nursing note reviewed.  Constitutional:      Appearance: Normal appearance. He is well-developed.  HENT:     Head: Normocephalic and atraumatic.     Right Ear: Tympanic membrane and external ear normal.     Left Ear: Tympanic membrane and external ear normal.     Nose: Nose normal.     Mouth/Throat:     Mouth: Mucous membranes are moist.     Pharynx: Oropharynx is clear.  Eyes:     Conjunctiva/sclera: Conjunctivae normal.  Cardiovascular:     Rate and Rhythm: Normal rate and regular rhythm.     Heart sounds: Normal heart sounds. No murmur heard. No friction rub. No gallop.   Pulmonary:     Effort: Pulmonary effort is normal.     Breath sounds: Normal breath sounds. No wheezing, rhonchi or rales.  Musculoskeletal:     Cervical back: Neck supple.  Skin:    General: Skin is warm.     Findings: Rash present.     Comments: Circular hyperpigmented rash throughout the upper/lower extremities b/l, abdominal area and upper back area with excoriation marks.   Neurological:     Mental Status: He is alert and oriented to person, place, and time.  Psychiatric:        Behavior: Behavior normal.    Previous notes and tests were reviewed. The plan was reviewed with the patient/family, and all questions/concerned were addressed.  It was my pleasure to see Nathaniel Robertson today and participate in his care. Please feel free to contact me with any questions or concerns.  Sincerely,  , DO Allergy & Immunology  [facility] office: [phone] [facility] office: [phone]

## 2020-07-07 NOTE — Patient Instructions (Addendum)
Skin:  Not sure what's causing the itching.   You can reintroduce soy and almond back into your diet slowly.  See below for proper skin care - use fragrance free and dye free products.  Moisturize 1-2 times a day with triamcinolone:Eucerin mix . For more than twice a day use the following: Aquaphor, Vaseline, Cerave, Cetaphil, Eucerin, Vanicream.  Itching: . Take Allegra 180mg  in the morning . Take Xyzal 5mg  in the evening.  to take famotidine as needed for heartburn and not for itching  Dupixent sample given today - every 2 weeks at home. This is also for asthma.   Tammy our biologics coordinator will be in contact with you regarding this.   Environmental allergies  2022 skin testing positive to weed pollen, tree pollen, dust mites, cat, cockroach, dog and ragweed pollen, grass.   Continue environmental control measures as below.  May use over the counter antihistamines such as Zyrtec (cetirizine), Claritin (loratadine), Allegra (fexofenadine), or Xyzal (levocetirizine) daily as needed as above.   May use Flonase (fluticasone) nasal spray 1 spray per nostril twice a day as needed for nasal congestion.   Nasal saline spray (i.e., Simply Saline) or nasal saline lavage (i.e., NeilMed) is recommended as needed and prior to medicated nasal sprays.  Will consider allergy injections in the future.   Breathing: Daily controller medication(s): Continue Singulair (montelukast) 10mg  daily at night. Continue Airduo Digihaler Molli Knock 1 puff twice a day and rinse mouth after each use.  Sample given.   START Dupixent injections every 2 weeks at home.  May use albuterol rescue inhaler 2 puffs every 4 to 6 hours as needed for shortness of breath, chest tightness, coughing, and wheezing. May use albuterol rescue inhaler 2 puffs 5 to 15 minutes prior to strenuous physical activities. Monitor frequency of use.  Breathing control goals:  Full participation in all desired activities (may  need albuterol before activity) Albuterol use two times or less a week on average (not counting use with activity) Cough interfering with sleep two times or less a month Oral steroids no more than once a year No hospitalizations  Follow up before you go to 2023 or sooner if needed.   Skin care recommendations  Bath time: . Always use lukewarm water. AVOID very hot or cold water. Keep bathing time to 5-10 minutes. . Do NOT use bubble bath. . Use a mild soap and use just enough to wash the dirty areas. . Do NOT scrub skin vigorously.  . After bathing, pat dry your skin with a towel. Do NOT rub or scrub the skin.  Moisturizers and prescriptions:  . ALWAYS apply moisturizers immediately after bathing (within 3 minutes). This helps to lock-in moisture. . Use the moisturizer several times a day over the whole body. summer moisturizers include: Aveeno, CeraVe, Cetaphil. [place] winter moisturizers include: Aquaphor, Vaseline, Cerave, Cetaphil, Eucerin, Vanicream. . When using moisturizers along with medications, the moisturizer should be applied about one hour after applying the medication to prevent diluting effect of the medication or moisturize around where you applied the medications. When not using medications, the moisturizer can be continued twice daily as maintenance.  Laundry and clothing: . Avoid laundry products with added color or perfumes. . Use unscented hypo-allergenic laundry products such as Tide free, Cheer free & gentle, and All free and clear.  . If the skin still seems dry or sensitive, you can try double-rinsing the clothes. . Avoid tight or scratchy clothing such as  wool. . Do not use fabric softeners or dyer sheets.  Reducing Pollen Exposure . Pollen seasons: trees (spring), grass (summer) and ragweed/weeds (fall). Marland Kitchen Keep windows closed in your home and car to lower pollen exposure.  Lilian Kapur air conditioning in the bedroom and throughout the house if  possible.  . Avoid going out in dry windy days - especially early morning. . Pollen counts are highest between 5 - 10 AM and on dry, hot and windy days.  . Save outside activities for late afternoon or after a heavy rain, when pollen levels are lower.  . Avoid mowing of grass if you have grass pollen allergy. Marland Kitchen Be aware that pollen can also be transported indoors on people and pets.  . Dry your clothes in an automatic dryer rather than hanging them outside where they might collect pollen.  . Rinse hair and eyes before bedtime. Control of House Dust Mite Allergen . Dust mite allergens are a common trigger of allergy and asthma symptoms. While they can be found throughout the house, these microscopic creatures thrive in warm, humid environments such as bedding, upholstered furniture and carpeting. . Because so much time is spent in the bedroom, it is essential to reduce mite levels there.  . Encase pillows, mattresses, and box springs in special allergen-proof fabric covers or airtight, zippered plastic covers.  . Bedding should be washed weekly in hot water (130 F) and dried in a hot dryer. Allergen-proof covers are available for comforters and pillows that can't be regularly washed.  Reyes Ivan the allergy-proof covers every few months. Minimize clutter in the bedroom. Keep pets out of the bedroom.  Marland Kitchen Keep humidity less than 50% by using a dehumidifier or air conditioning. You can buy a humidity measuring device called a hygrometer to monitor this.  . If possible, replace carpets with hardwood, linoleum, or washable area rugs. If that's not possible, vacuum frequently with a vacuum that has a HEPA filter. . Remove all upholstered furniture and non-washable window drapes from the bedroom. . Remove all non-washable stuffed toys from the bedroom.  Wash stuffed toys weekly. Pet Allergen Avoidance: . Contrary to popular opinion, there are no "hypoallergenic" breeds of dogs or cats. That is because people  are not allergic to an animal's hair, but to an allergen found in the animal's saliva, dander (dead skin flakes) or urine. Pet allergy symptoms typically occur within minutes. For some people, symptoms can build up and become most severe 8 to 12 hours after contact with the animal. People with severe allergies can experience reactions in public places if dander has been transported on the pet owners' clothing. Marland Kitchen Keeping an animal outdoors is only a partial solution, since homes with pets in the yard still have higher concentrations of animal allergens. . Before getting a pet, ask your allergist to determine if you are allergic to animals. If your pet is already considered part of your family, try to minimize contact and keep the pet out of the bedroom and other rooms where you spend a great deal of time. . As with dust mites, vacuum carpets often or replace carpet with a hardwood floor, tile or linoleum. . High-efficiency particulate air (HEPA) cleaners can reduce allergen levels over time. . While dander and saliva are the source of cat and dog allergens, urine is the source of allergens from rabbits, hamsters, mice and [place] pigs; so ask a non-allergic family member to clean the animal's cage. . If you have a pet allergy,  talk to your allergist about the potential for allergy immunotherapy (allergy shots). This strategy can often provide long-term relief.

## 2020-07-07 NOTE — Assessment & Plan Note (Signed)
Past history - Rhinoconjunctivitis symptoms mainly in the spring.  2017 skin testing was positive to trees, cockroach, grass, cat and dust mites.  Patient was on allergy med therapy [place] for 2 years with some benefit. 2022 skin testing showed: Positive to weed pollen, tree pollen, dust mites, cat, cockroach, dog and ragweed pollen, grass.  Interim history - worsening symptoms the past few weeks.   Continue environmental control measures as below.  May use over the counter antihistamines such as Zyrtec (cetirizine), Claritin (loratadine), Allegra (fexofenadine), or Xyzal (levocetirizine) daily as needed as above.   May use Flonase (fluticasone) nasal spray 1 spray per nostril twice a day as needed for nasal congestion.   Nasal saline spray (i.e., Simply Saline) or nasal saline lavage (i.e., NeilMed) is recommended as needed and prior to medicated nasal sprays.  Will consider allergy injections in the future once pruritus and asthma is more stable.

## 2020-07-07 NOTE — Assessment & Plan Note (Signed)
Past history - Whole body pruritus for many years and now having some hyperpigmented excoriating changes on the skin.  Patient was seen by allergy in 2017 and dermatology in the past with no identifiable triggers.  2022 skin testing showed: Positive to weed pollen, tree pollen, dust mites, cat, cockroach, dog and ragweed pollen, grass. Borderline positive to soy and almonds.  Interim history - unchanged. No benefit with adding Pepcid or removing soy/almonds from diet.    Not sure what's causing the itching.  Reintroduce soy and almond back into diet slowly.  See below for proper skin care - use fragrance free and dye free products.  Moisturize 1-2 times a day with triamcinolone:Eucerin mix . For more than twice a day use the following: Aquaphor, Vaseline, Cerave, Cetaphil, Eucerin, Vanicream.  Itching: . Take Allegra 180mg  in the morning . Take Xyzal 5mg  in the evening.  to take famotidine as needed for heartburn and not for itching  Dupixent 600mg  loading dose sample given today. Continue 300mg  every 2 weeks at home to see if it helps his skin issues. Also starting Dupixent for asthma as below.   If no improvement, will refer back to dermatology.

## 2020-07-07 NOTE — Assessment & Plan Note (Signed)
Past history - Reports shortness of breath at times but much better with Singulair and Xyzal.  He also uses Symbicort 160 mcg as needed and has been using it more frequently the last few days. 2022 spirometry shows some restriction with 30% improvement in FEV1 post bronchodilator treatment.  Clinically feeling improved. Interim history - stable.   ACT score 20.   Today's spirometry showed restriction. Daily controller medication(s): Continue Singulair (montelukast) 10mg  daily at night. Continue Airduo Digihaler 1 puff twice a day and rinse mouth after each use. Sample given.  START Dupixent injections every 2 weeks at home. Sample dose given in office today. May use albuterol rescue inhaler 2 puffs every 4 to 6 hours as needed for shortness of breath, chest tightness, coughing, and wheezing. May use albuterol rescue inhaler 2 puffs 5 to 15 minutes prior to strenuous physical activities. Monitor frequency of use.  Repeat spirometry at next visit.

## 2020-07-09 ENCOUNTER — Ambulatory Visit: Payer: 59 | Admitting: Allergy

## 2020-07-10 ENCOUNTER — Telehealth: Payer: Self-pay | Admitting: *Deleted

## 2020-07-10 NOTE — Telephone Encounter (Signed)
-----   Message from Ellamae Sia, DO sent at 07/07/2020  4:29 PM EDT ----- Please start PA for Dupixent 300mg  every 2 weeks for asthma. 600mg  loading dose given today. Patient wants to self inject at home. Thank you.

## 2020-07-10 NOTE — Telephone Encounter (Signed)
L/M for patient to contact me to advise approval, copay card and submit to [facility]

## 2020-07-14 ENCOUNTER — Ambulatory Visit: Payer: 59 | Admitting: Allergy

## 2020-07-14 NOTE — Telephone Encounter (Signed)
L/m for patient to contact me  

## 2020-07-20 NOTE — Telephone Encounter (Signed)
Patient called back [place] and explained to patient submit, copay card, storage and dosing

## 2020-07-20 NOTE — Telephone Encounter (Signed)
L/m again for patient to contact me. If patient does respond will let you know. Otherwise I will wait for patient to reach out in future with interest in Dupixent

## 2020-07-21 ENCOUNTER — Ambulatory Visit: Payer: Self-pay

## 2020-08-18 ENCOUNTER — Other Ambulatory Visit: Payer: Self-pay

## 2020-08-18 ENCOUNTER — Encounter: Payer: Self-pay | Admitting: Allergy

## 2020-08-18 ENCOUNTER — Ambulatory Visit (INDEPENDENT_AMBULATORY_CARE_PROVIDER_SITE_OTHER): Payer: 59 | Admitting: Allergy

## 2020-08-18 VITALS — BP 118/78 | HR 80 | Temp 97.7°F | Resp 18

## 2020-08-18 DIAGNOSIS — H1013 Acute atopic conjunctivitis, bilateral: Secondary | ICD-10-CM | POA: Diagnosis not present

## 2020-08-18 DIAGNOSIS — J3089 Other allergic rhinitis: Secondary | ICD-10-CM

## 2020-08-18 DIAGNOSIS — J302 Other seasonal allergic rhinitis: Secondary | ICD-10-CM | POA: Diagnosis not present

## 2020-08-18 DIAGNOSIS — H1010 Acute atopic conjunctivitis, unspecified eye: Secondary | ICD-10-CM

## 2020-08-18 DIAGNOSIS — J4550 Severe persistent asthma, uncomplicated: Secondary | ICD-10-CM

## 2020-08-18 DIAGNOSIS — L299 Pruritus, unspecified: Secondary | ICD-10-CM | POA: Diagnosis not present

## 2020-08-18 NOTE — Assessment & Plan Note (Signed)
Past history - Whole body pruritus for many years and now having some hyperpigmented excoriating changes on the skin.  Patient was seen by allergy in 2017 and dermatology in the past with no identifiable triggers.  2022 skin testing showed: Positive to weed pollen, tree pollen, dust mites, cat, cockroach, dog and ragweed pollen, grass. Borderline positive to soy and almonds.  Interim history - Started Dupixent and itching 50% improved.    Not sure what's causing the itching. The rash is most likely due to patient picking at his skin.   See below for proper skin care - use fragrance free and dye free products.  Moisturize 1-2 times a day with triamcinolone:Eucerin mix . For more than twice a day use the following: Aquaphor, Vaseline, Cerave, Cetaphil, Eucerin, Vanicream.  Itching: . Take Allegra 180mg  in the morning . Take Xyzal 5mg  in the evening.  to take famotidine as needed for heartburn and not for itching  Continue Dupixent injections every 2 weeks at home.

## 2020-08-18 NOTE — Progress Notes (Signed)
Follow Up Note  RE: Nathaniel Robertson MRN: [id number] DOB: [birth year] Date of Office Visit: 08/18/2020  Referring provider: Georgianne Fick, MD Primary care provider: Georgianne Fick, MD  Chief Complaint: Asthma (Doing good)  History of Present Illness: I had the pleasure of seeing Nathaniel Robertson for a follow up visit at the [facility] on 08/18/2020. He is a 60 y.o. male, who is being followed for pruritus, asthma on Dupixent, allergic rhinoconjunctivitis. His previous allergy office visit was on 07/07/2020 with Dr. [name]. Today is a regular follow up visit.  Pruritus Noted 50% improvement with the itching since started on Dupixent. Patient was on [place] for 2 weeks and had no issues with itching there. Using moisturizer daily, allegra in the morning and Xyzal at night. No issues with soy and almonds now.   Lot of stress at work with staff shortages.   Asthma ACT score 23. Currently on Dupixent injections every 2 weeks at home and doing well.  Patient is going to [place] next week for 2 weeks.  Breathing is improved and using albuterol less - only had to use 2-3 times the past month.  Taking montelukast daily and not taking Airduo daily.   Seasonal and perennial allergic rhinoconjunctivitis Symptoms stable since indoors.  Not using any nasal sprays.   Assessment and Plan: [name] is a 60 y.o. male with: Pruritus Past history - Whole body pruritus for many years and now having some hyperpigmented excoriating changes on the skin.  Patient was seen by allergy in 2017 and dermatology in the past with no identifiable triggers.  2022 skin testing showed: Positive to weed pollen, tree pollen, dust mites, cat, cockroach, dog and ragweed pollen, grass. Borderline positive to soy and almonds.  Interim history - Started Dupixent and itching 50% improved.    Not sure what's causing the itching. The rash is most likely due to patient picking at his skin.    See below for proper skin care - use fragrance free and dye free products.  Moisturize 1-2 times a day with triamcinolone:Eucerin mix . For more than twice a day use the following: Aquaphor, Vaseline, Cerave, Cetaphil, Eucerin, Vanicream.  Itching: . Take Allegra 180mg  in the morning . Take Xyzal 5mg  in the evening.  to take famotidine as needed for heartburn and not for itching  Continue Dupixent injections every 2 weeks at home.   Asthma Past history - Reports shortness of breath at times but much better with Singulair and Xyzal.  He also uses Symbicort 160 mcg as needed and has been using it more frequently the last few days. 2022 spirometry shows some restriction with 30% improvement in FEV1 post bronchodilator treatment.  Clinically feeling improved. Interim history - improved with Dupixent. Not using Airduo daily.  ACT score 23.   Today's spirometry showed restriction. Daily controller medication(s): Continue Singulair (montelukast) 10mg  daily at night. Take Airduo Digihaler Molli Knock 1 puff twice a day and rinse mouth after each use. Continue Dupixent injections every 2 weeks at home.  May use albuterol rescue inhaler 2 puffs every 4 to 6 hours as needed for shortness of breath, chest tightness, coughing, and wheezing. May use albuterol rescue inhaler 2 puffs 5 to 15 minutes prior to strenuous physical activities. Monitor frequency of use.  Get spirometry at next visit.  Seasonal and perennial allergic rhinoconjunctivitis Past history - Rhinoconjunctivitis symptoms mainly in the spring.  2017 skin testing was positive to trees, cockroach, grass, cat and  dust mites.  Patient was on allergy med therapy [place] for 2 years with some benefit. 2022 skin testing showed: Positive to weed pollen, tree pollen, dust mites, cat, cockroach, dog and ragweed pollen, grass.  Interim history - asymptomatic since not outdoors.   Continue environmental control measures as below.  May  use over the counter antihistamines such as Zyrtec (cetirizine), Claritin (loratadine), Allegra (fexofenadine), or Xyzal (levocetirizine) daily as needed as above.   May use Flonase (fluticasone) nasal spray 1 spray per nostril twice a day as needed for nasal congestion.   Nasal saline spray (i.e., Simply Saline) or nasal saline lavage (i.e., NeilMed) is recommended as needed and prior to medicated nasal sprays.  Consider allergy injections in the future once pruritus and asthma is more stable.  Return in about 3 months (around 11/18/2020).  No orders of the defined types were placed in this encounter.  Lab Orders  No laboratory test(s) ordered today    Diagnostics: Spirometry:  Tracings reviewed. His effort: Good reproducible efforts. FVC: 2.72L FEV1: 1.92L, 55% predicted FEV1/FVC ratio: 71% Interpretation: Spirometry consistent with possible restrictive disease.  Please see scanned spirometry results for details.  Medication List:  Current Outpatient Medications  Medication Sig Dispense Refill  . albuterol (PROVENTIL HFA;VENTOLIN HFA) 108 (90 BASE) MCG/ACT inhaler Inhale 2 puffs into the lungs every 6 (six) hours as needed for wheezing or shortness of breath.    Marland Kitchen amLODipine (NORVASC) 10 MG tablet TK 1 T PO QD  2  . atorvastatin (LIPITOR) 10 MG tablet TK 1 T PO  D. GEF LIPITOR  1  . Cholecalciferol (VITAMIN D-1000 MAX ST PO) Take by mouth.    . EPINEPHrine 0.3 mg/0.3 mL IJ SOAJ injection Use as directed for life-threatening allergic reaction. 1 each 2  . Fluticasone-Salmeterol,sensor, (AIRDUO DIGIHALER) 232-14 MCG/ACT AEPB Inhale 1 puff into the lungs in the morning and at bedtime. Rinse mouth after each use. 1 each 5  . levocetirizine (XYZAL) 5 MG tablet Take 5 mg by mouth every evening.    Marland Kitchen losartan (COZAAR) 100 MG tablet Take 100 mg by mouth daily.  0  . metFORMIN (GLUCOPHAGE-XR) 500 MG 24 hr tablet Take 500 mg by mouth at bedtime.   0  . montelukast (SINGULAIR) 10 MG tablet  Take 10 mg by mouth at bedtime. Reported on 06/23/2015    . Omega 3-6-9 Fatty Acids (OMEGA 3-6-9 COMPLEX PO) Take by mouth.    . triamcinolone ointment (KENALOG) 0.1 % Apply 1 application topically 2 (two) times daily. Use as moisturizer 453 g 1  . diclofenac (VOLTAREN) 50 MG EC tablet Take 50 mg by mouth 2 (two) times daily. (Patient not taking: Reported on 08/18/2020)     Current Facility-Administered Medications  Medication Dose Route Frequency Provider Last Rate Last Admin  . dupilumab (DUPIXENT) prefilled syringe 300 mg  300 mg Subcutaneous Once Ellamae Sia, DO       Allergies: No Known Allergies I reviewed his past medical history, social history, family history, and environmental history and no significant changes have been reported from his previous visit.  Review of Systems  Constitutional: Negative for appetite change, chills, fever and unexpected weight change.  HENT: Negative for congestion and rhinorrhea.   Eyes: Negative for itching.  Respiratory: Negative for cough, chest tightness, shortness of breath and wheezing.   Cardiovascular: Negative for chest pain.  Gastrointestinal: Negative for abdominal pain.  Genitourinary: Negative for difficulty urinating.  Skin: Positive for rash.  Allergic/Immunologic: Positive for  environmental allergies.  Neurological: Negative for headaches.   Objective: BP 118/78   Pulse 80   Temp 97.7 F (36.5 C) (Temporal)   Resp 18  There is no height or weight on file to calculate BMI. Physical Exam Vitals and nursing note reviewed.  Constitutional:      Appearance: Normal appearance. He is well-developed.  HENT:     Head: Normocephalic and atraumatic.     Right Ear: Tympanic membrane and external ear normal.     Left Ear: Tympanic membrane and external ear normal.     Nose: Nose normal.     Mouth/Throat:     Mouth: Mucous membranes are moist.     Pharynx: Oropharynx is clear.  Eyes:     Conjunctiva/sclera: Conjunctivae normal.   Cardiovascular:     Rate and Rhythm: Normal rate and regular rhythm.     Heart sounds: Normal heart sounds. No murmur heard. No friction rub. No gallop.   Pulmonary:     Effort: Pulmonary effort is normal.     Breath sounds: Normal breath sounds. No wheezing, rhonchi or rales.  Musculoskeletal:     Cervical back: Neck supple.  Skin:    General: Skin is warm.     Findings: Rash present.     Comments: Circular hyperpigmented rash throughout the upper/lower extremities b/l, abdominal area and upper back area with excoriation marks.   Neurological:     Mental Status: He is alert and oriented to person, place, and time.  Psychiatric:        Behavior: Behavior normal.    Previous notes and tests were reviewed. The plan was reviewed with the patient/family, and all questions/concerned were addressed.  It was my pleasure to see Keron today and participate in his care. Please feel free to contact me with any questions or concerns.  Sincerely,  Wyline Mood, DO Allergy & Immunology  [facility] office: [phone] [facility] office: [phone]

## 2020-08-18 NOTE — Assessment & Plan Note (Signed)
Past history - Rhinoconjunctivitis symptoms mainly in the spring.  2017 skin testing was positive to trees, cockroach, grass, cat and dust mites.  Patient was on allergy med therapy [place] for 2 years with some benefit. 2022 skin testing showed: Positive to weed pollen, tree pollen, dust mites, cat, cockroach, dog and ragweed pollen, grass.  Interim history - asymptomatic since not outdoors.   Continue environmental control measures as below.  May use over the counter antihistamines such as Zyrtec (cetirizine), Claritin (loratadine), Allegra (fexofenadine), or Xyzal (levocetirizine) daily as needed as above.   May use Flonase (fluticasone) nasal spray 1 spray per nostril twice a day as needed for nasal congestion.   Nasal saline spray (i.e., Simply Saline) or nasal saline lavage (i.e., NeilMed) is recommended as needed and prior to medicated nasal sprays.  Consider allergy injections in the future once pruritus and asthma is more stable.

## 2020-08-18 NOTE — Patient Instructions (Addendum)
Skin:  Not sure what's causing the itching.   See below for proper skin care - use fragrance free and dye free products.  Moisturize 1-2 times a day with triamcinolone:Eucerin mix . For more than twice a day use the following: Aquaphor, Vaseline, Cerave, Cetaphil, Eucerin, Vanicream.  Itching: . Take Allegra 180mg  in the morning . Take Xyzal 5mg  in the evening.  to take famotidine as needed for heartburn and not for itching  Continue Dupixent injections every 2 weeks at home.   Environmental allergies  2022 skin testing positive to weed pollen, tree pollen, dust mites, cat, cockroach, dog and ragweed pollen, grass.   Continue environmental control measures as below.  May use over the counter antihistamines such as Zyrtec (cetirizine), Claritin (loratadine), Allegra (fexofenadine), or Xyzal (levocetirizine) daily as needed as above.   May use Flonase (fluticasone) nasal spray 1 spray per nostril twice a day as needed for nasal congestion.   Nasal saline spray (i.e., Simply Saline) or nasal saline lavage (i.e., NeilMed) is recommended as needed and prior to medicated nasal sprays.  Breathing: Daily controller medication(s): Continue Singulair (montelukast) 10mg  daily at night. Take Airduo Digihaler Molli Knock 1 puff twice a day and rinse mouth after each use.  Continue Dupixent injections every 2 weeks at home.  May use albuterol rescue inhaler 2 puffs every 4 to 6 hours as needed for shortness of breath, chest tightness, coughing, and wheezing. May use albuterol rescue inhaler 2 puffs 5 to 15 minutes prior to strenuous physical activities. Monitor frequency of use.  Breathing control goals:  Full participation in all desired activities (may need albuterol before activity) Albuterol use two times or less a week on average (not counting use with activity) Cough interfering with sleep two times or less a month Oral steroids no more than once a year No hospitalizations  Follow up  in 3 months or sooner if needed.   Skin care recommendations  Bath time: . Always use lukewarm water. AVOID very hot or cold water. 2023 Keep bathing time to 5-10 minutes. . Do NOT use bubble bath. . Use a mild soap and use just enough to wash the dirty areas. . Do NOT scrub skin vigorously.  . After bathing, pat dry your skin with a towel. Do NOT rub or scrub the skin.  Moisturizers and prescriptions:  . ALWAYS apply moisturizers immediately after bathing (within 3 minutes). This helps to lock-in moisture. . Use the moisturizer several times a day over the whole body. summer moisturizers include: Aveeno, CeraVe, Cetaphil. winter moisturizers include: Aquaphor, Vaseline, Cerave, Cetaphil, Eucerin, Vanicream. . When using moisturizers along with medications, the moisturizer should be applied about one hour after applying the medication to prevent diluting effect of the medication or moisturize around where you applied the medications. When not using medications, the moisturizer can be continued twice daily as maintenance.  Laundry and clothing: . Avoid laundry products with added color or perfumes. . Use unscented hypo-allergenic laundry products such as Tide free, Cheer free & gentle, and All free and clear.  . If the skin still seems dry or sensitive, you can try double-rinsing the clothes. . Avoid tight or scratchy clothing such as wool. . Do not use fabric softeners or dyer sheets.  Reducing Pollen Exposure . Pollen seasons: trees (spring), grass (summer) and ragweed/weeds (fall). Peri Jefferson Keep windows closed in your home and car to lower pollen exposure.  03-26-1983 air conditioning in the bedroom and throughout the  house if possible.  . Avoid going out in dry windy days - especially early morning. . Pollen counts are highest between 5 - 10 AM and on dry, hot and windy days.  . Save outside activities for late afternoon or after a heavy rain, when pollen levels are lower.   . Avoid mowing of grass if you have grass pollen allergy. Marland Kitchen Be aware that pollen can also be transported indoors on people and pets.  . Dry your clothes in an automatic dryer rather than hanging them outside where they might collect pollen.  . Rinse hair and eyes before bedtime. Control of House Dust Mite Allergen . Dust mite allergens are a common trigger of allergy and asthma symptoms. While they can be found throughout the house, these microscopic creatures thrive in warm, humid environments such as bedding, upholstered furniture and carpeting. . Because so much time is spent in the bedroom, it is essential to reduce mite levels there.  . Encase pillows, mattresses, and box springs in special allergen-proof fabric covers or airtight, zippered plastic covers.  . Bedding should be washed weekly in hot water (130 F) and dried in a hot dryer. Allergen-proof covers are available for comforters and pillows that can't be regularly washed.  Reyes Ivan the allergy-proof covers every few months. Minimize clutter in the bedroom. Keep pets out of the bedroom.  Marland Kitchen Keep humidity less than 50% by using a dehumidifier or air conditioning. You can buy a humidity measuring device called a hygrometer to monitor this.  . If possible, replace carpets with hardwood, linoleum, or washable area rugs. If that's not possible, vacuum frequently with a vacuum that has a HEPA filter. . Remove all upholstered furniture and non-washable window drapes from the bedroom. . Remove all non-washable stuffed toys from the bedroom.  Wash stuffed toys weekly. Pet Allergen Avoidance: . Contrary to popular opinion, there are no "hypoallergenic" breeds of dogs or cats. That is because people are not allergic to an animal's hair, but to an allergen found in the animal's saliva, dander (dead skin flakes) or urine. Pet allergy symptoms typically occur within minutes. For some people, symptoms can build up and become most severe 8 to 12 hours  after contact with the animal. People with severe allergies can experience reactions in public places if dander has been transported on the pet owners' clothing. Marland Kitchen Keeping an animal outdoors is only a partial solution, since homes with pets in the yard still have higher concentrations of animal allergens. . Before getting a pet, ask your allergist to determine if you are allergic to animals. If your pet is already considered part of your family, try to minimize contact and keep the pet out of the bedroom and other rooms where you spend a great deal of time. . As with dust mites, vacuum carpets often or replace carpet with a hardwood floor, tile or linoleum. . High-efficiency particulate air (HEPA) cleaners can reduce allergen levels over time. . While dander and saliva are the source of cat and dog allergens, urine is the source of allergens from rabbits, hamsters, mice and [place] pigs; so ask a non-allergic family member to clean the animal's cage. . If you have a pet allergy, talk to your allergist about the potential for allergy immunotherapy (allergy shots). This strategy can often provide long-term relief.

## 2020-08-18 NOTE — Assessment & Plan Note (Signed)
Past history - Reports shortness of breath at times but much better with Singulair and Xyzal.  He also uses Symbicort 160 mcg as needed and has been using it more frequently the last few days. 2022 spirometry shows some restriction with 30% improvement in FEV1 post bronchodilator treatment.  Clinically feeling improved. Interim history - improved with Dupixent. Not using Airduo daily.  ACT score 23.   Today's spirometry showed restriction. Daily controller medication(s): Continue Singulair (montelukast) 10mg  daily at night. Take Airduo Digihaler 1 puff twice a day and rinse mouth after each use. Continue Dupixent injections every 2 weeks at home.  May use albuterol rescue inhaler 2 puffs every 4 to 6 hours as needed for shortness of breath, chest tightness, coughing, and wheezing. May use albuterol rescue inhaler 2 puffs 5 to 15 minutes prior to strenuous physical activities. Monitor frequency of use.  Get spirometry at next visit.

## 2020-10-30 ENCOUNTER — Encounter: Payer: Self-pay | Admitting: Allergy

## 2020-11-19 ENCOUNTER — Encounter: Payer: Self-pay | Admitting: Allergy

## 2020-11-19 ENCOUNTER — Other Ambulatory Visit: Payer: Self-pay

## 2020-11-19 ENCOUNTER — Ambulatory Visit (INDEPENDENT_AMBULATORY_CARE_PROVIDER_SITE_OTHER): Payer: 59 | Admitting: Allergy

## 2020-11-19 VITALS — BP 124/78 | HR 77 | Resp 16

## 2020-11-19 DIAGNOSIS — J4550 Severe persistent asthma, uncomplicated: Secondary | ICD-10-CM

## 2020-11-19 DIAGNOSIS — J302 Other seasonal allergic rhinitis: Secondary | ICD-10-CM | POA: Diagnosis not present

## 2020-11-19 DIAGNOSIS — J3089 Other allergic rhinitis: Secondary | ICD-10-CM

## 2020-11-19 DIAGNOSIS — H1010 Acute atopic conjunctivitis, unspecified eye: Secondary | ICD-10-CM

## 2020-11-19 DIAGNOSIS — L299 Pruritus, unspecified: Secondary | ICD-10-CM

## 2020-11-19 DIAGNOSIS — H1013 Acute atopic conjunctivitis, bilateral: Secondary | ICD-10-CM | POA: Diagnosis not present

## 2020-11-19 NOTE — Assessment & Plan Note (Signed)
Past history - Reports shortness of breath at times but much better with Singulair and Xyzal.  He also uses Symbicort 160 mcg as needed and has been using it more frequently the last few days. 2022 spirometry shows some restriction with 30% improvement in FEV1 post bronchodilator treatment.  Clinically feeling improved. Interim history - was doing well up until 1 week ago, not sure if it's a combination of ragweed season with being off Dupixent and possibly getting URI.  Today's spirometry showed mixed restriction and obstruction. . Take mucinex twice a day with plenty of water.  . For the next 7 days - take albuterol 2 puffs prior to using Airduo. . If breathing is not improved, then start prednisone.  Start prednisone taper. Prednisone 10mg  tablets - take 2 tablets for 4 days then 1 tablet on day 5.   Make sure to monitor sugars when taking prednisone.  . Daily controller medication(s): Continue Singulair (montelukast) 10mg  daily at night. . Take Airduo Digihaler 1 puff twice a day and rinse mouth after each use. . May use albuterol rescue inhaler 2 puffs every 4 to 6 hours as needed for shortness of breath, chest tightness, coughing, and wheezing. May use albuterol rescue inhaler 2 puffs 5 to 15 minutes prior to strenuous physical activities. Monitor frequency of use.  . Get spirometry at next visit. Restart Dupixent as above.

## 2020-11-19 NOTE — Assessment & Plan Note (Signed)
Past history - Rhinoconjunctivitis symptoms mainly in the spring.  2017 skin testing was positive to trees, cockroach, grass, cat and dust mites.  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 2022 skin testing showed: Positive to weed pollen, tree pollen, dust mites, cat, cockroach, dog and ragweed pollen, grass.  Interim history - increased rhinitis symptoms the past week.   Continue environmental control measures as below.  May use over the counter antihistamines such as Zyrtec (cetirizine), Claritin (loratadine), Allegra (fexofenadine), or Xyzal (levocetirizine) daily as needed as above.  . Use Flonase (fluticasone) nasal spray 1 spray per nostril twice a day as needed for nasal congestion.   Nasal saline spray (i.e., Simply Saline) or nasal saline lavage (i.e., NeilMed) is recommended as needed and prior to medicated nasal sprays.  Consider allergy injections for long term control if above medications do not help the symptoms - handout given.

## 2020-11-19 NOTE — Assessment & Plan Note (Signed)
Past history - Whole body pruritus for many years and now having some hyperpigmented excoriating changes on the skin.  Patient was seen by allergy in 2017 and dermatology in the past with no identifiable triggers.  2022 skin testing showed: Positive to weed pollen, tree pollen, dust mites, cat, cockroach, dog and ragweed pollen, grass. Borderline positive to soy and almonds.  Interim history - stopped Dupixent due to blurry vision/gritty eye feeling. Did not have eyes evaluated yet as recommended. Itching is slightly worse now since stopped Dupixent.  Continue proper skin care - use fragrance free and dye free products.  Moisturize 1-2 times a day with triamcinolone:Eucerin mix . For more than twice a day use the following: Aquaphor, Vaseline, Cerave, Cetaphil, Eucerin, Vanicream.  Itching: . Take Allegra 180mg  in the morning . Take Xyzal 5mg  in the evening.   Restart Dupixent injections every 2 weeks at home.   If you notice worsening eye symptoms then let me know and stop.  Have eyes evaluated.

## 2020-11-19 NOTE — Patient Instructions (Addendum)
Skin: Continue proper skin care - use fragrance free and dye free products. Moisturize 1-2 times a day with triamcinolone:Eucerin mix For more than twice a day use the following: Aquaphor, Vaseline, Cerave, Cetaphil, Eucerin, Vanicream.  Itching: Take Allegra 180mg  in the morning Take Xyzal 5mg  in the evening.  Restart Dupixent injections every 2 weeks at home.  If you notice worsening eye symptoms then let me know and stop. Please go see an eye doctor.  Environmental allergies 2022 skin testing positive to weed pollen, tree pollen, dust mites, cat, cockroach, dog and ragweed pollen, grass.  Continue environmental control measures as below. May use over the counter antihistamines such as Zyrtec (cetirizine), Claritin (loratadine), Allegra (fexofenadine), or Xyzal (levocetirizine) daily as needed as above.  Use Flonase (fluticasone) nasal spray 1 spray per nostril twice a day as needed for nasal congestion.  Nasal saline spray (i.e., Simply Saline) or nasal saline lavage (i.e., NeilMed) is recommended as needed and prior to medicated nasal sprays. Consider allergy injections for long term control if above medications do not help the symptoms - handout given.   Breathing: Take mucinex twice a day with plenty of water.  For the next 7 days - take albuterol 2 puffs prior to using Airduo. If you still feel like your breathing is not improved, then start prednisone. Start prednisone taper. Prednisone 10mg  tablets - take 2 tablets for 4 days then 1 tablet on day 5.  Make sure you check your sugars when taking prednisone.   Daily controller medication(s): Continue Singulair (montelukast) 10mg  daily at night. Take Airduo Digihaler 1 puff twice a day and rinse mouth after each use. May use albuterol rescue inhaler 2 puffs every 4 to 6 hours as needed for shortness of breath, chest tightness, coughing, and wheezing. May use albuterol rescue inhaler 2 puffs 5 to 15 minutes prior to strenuous  physical activities. Monitor frequency of use.  Breathing control goals:  Full participation in all desired activities (may need albuterol before activity) Albuterol use two times or less a week on average (not counting use with activity) Cough interfering with sleep two times or less a month Oral steroids no more than once a year No hospitalizations  Follow up in 3 months or sooner if needed.   Skin care recommendations  Bath time: Always use lukewarm water. AVOID very hot or cold water. Keep bathing time to 5-10 minutes. Do NOT use bubble bath. Use a mild soap and use just enough to wash the dirty areas. Do NOT scrub skin vigorously.  After bathing, pat dry your skin with a towel. Do NOT rub or scrub the skin.  Moisturizers and prescriptions:  ALWAYS apply moisturizers immediately after bathing (within 3 minutes). This helps to lock-in moisture. Use the moisturizer several times a day over the whole body. Good summer moisturizers include: Aveeno, CeraVe, Cetaphil. Good winter moisturizers include: Aquaphor, Vaseline, Cerave, Cetaphil, Eucerin, Vanicream. When using moisturizers along with medications, the moisturizer should be applied about one hour after applying the medication to prevent diluting effect of the medication or moisturize around where you applied the medications. When not using medications, the moisturizer can be continued twice daily as maintenance.  Laundry and clothing: Avoid laundry products with added color or perfumes. Use unscented hypo-allergenic laundry products such as Tide free, Cheer free & gentle, and All free and clear.  If the skin still seems dry or sensitive, you can try double-rinsing the clothes. Avoid tight or scratchy clothing such as wool. Do not  use fabric softeners or dyer sheets.  Reducing Pollen Exposure Pollen seasons: trees (spring), grass (summer) and ragweed/weeds (fall). Keep windows closed in your home and car to lower pollen  exposure.  Install air conditioning in the bedroom and throughout the house if possible.  Avoid going out in dry windy days - especially early morning. Pollen counts are highest between 5 - 10 AM and on dry, hot and windy days.  Save outside activities for late afternoon or after a heavy rain, when pollen levels are lower.  Avoid mowing of grass if you have grass pollen allergy. Be aware that pollen can also be transported indoors on people and pets.  Dry your clothes in an automatic dryer rather than hanging them outside where they might collect pollen.  Rinse hair and eyes before bedtime. Control of House Dust Mite Allergen Dust mite allergens are a common trigger of allergy and asthma symptoms. While they can be found throughout the house, these microscopic creatures thrive in warm, humid environments such as bedding, upholstered furniture and carpeting. Because so much time is spent in the bedroom, it is essential to reduce mite levels there.  Encase pillows, mattresses, and box springs in special allergen-proof fabric covers or airtight, zippered plastic covers.  Bedding should be washed weekly in hot water (130 F) and dried in a hot dryer. Allergen-proof covers are available for comforters and pillows that can't be regularly washed.  Wash the allergy-proof covers every few months. Minimize clutter in the bedroom. Keep pets out of the bedroom.  Keep humidity less than 50% by using a dehumidifier or air conditioning. You can buy a humidity measuring device called a hygrometer to monitor this.  If possible, replace carpets with hardwood, linoleum, or washable area rugs. If that's not possible, vacuum frequently with a vacuum that has a HEPA filter. Remove all upholstered furniture and non-washable window drapes from the bedroom. Remove all non-washable stuffed toys from the bedroom.  Wash stuffed toys weekly. Pet Allergen Avoidance: Contrary to popular opinion, there are no "hypoallergenic"  breeds of dogs or cats. That is because people are not allergic to an animal's hair, but to an allergen found in the animal's saliva, dander (dead skin flakes) or urine. Pet allergy symptoms typically occur within minutes. For some people, symptoms can build up and become most severe 8 to 12 hours after contact with the animal. People with severe allergies can experience reactions in public places if dander has been transported on the pet owners' clothing. Keeping an animal outdoors is only a partial solution, since homes with pets in the yard still have higher concentrations of animal allergens. Before getting a pet, ask your allergist to determine if you are allergic to animals. If your pet is already considered part of your family, try to minimize contact and keep the pet out of the bedroom and other rooms where you spend a great deal of time. As with dust mites, vacuum carpets often or replace carpet with a hardwood floor, tile or linoleum. High-efficiency particulate air (HEPA) cleaners can reduce allergen levels over time. While dander and saliva are the source of cat and dog allergens, urine is the source of allergens from rabbits, hamsters, mice and [place] pigs; so ask a non-allergic family member to clean the animal's cage. If you have a pet allergy, talk to your allergist about the potential for allergy immunotherapy (allergy shots). This strategy can often provide long-term relief.

## 2020-11-19 NOTE — Progress Notes (Signed)
Follow Up Note  RE: Saiquan Hands MRN: [id number] DOB: [birth year] Date of Office Visit: 11/19/2020  Referring provider: Georgianne Fick, MD Primary care provider: Georgianne Fick, MD  Chief Complaint: Pruritus  History of Present Illness: I had the pleasure of seeing Erian Trudel for a follow up visit at the [facility] on 11/19/2020. He is a 60 y.o. male, who is being followed for pruritus, asthma, allergic rhinoconjunctivitis. His previous allergy office visit was on 08/18/2020 with Dr. [name]. Today is a regular follow up visit.  Pruritus Patient stopped Dupixent for 3 weeks with no change in his blurry vision. The blurry vision only occurs sometimes and he feels like there's something gritty in his eyes when this happens. He has not seen an eye doctor yet.  The Dupixent has been helping with his skin issues and he did notice some increased itching/rash lately - not sure if it's from the change of weather or because he is off Dupixent. He is about 3 weeks overdue now.   Currently on Allegra 180mg  in the morning, Xyzal 5mg  in the evening.   Not on famotidine anymore and denies heartburn symptoms.  Moisturizing daily.   Asthma Currently on Singulair 10mg  daily, Aidruo 232 1 puff twice a day and using albuterol as needed. Currently having some PND and coughing. No fevers/chills. 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  Denies any ER/urgent care visits or prednisone use since the last visit.  Seasonal and perennial allergic rhinoconjunctivitis Noticed some symptoms for the past 1 week with coughing and nasal congestion. Using Flonase 1 spray per nostril once a day.  Assessment and Plan: Mahamadou is a 60 y.o. male with: Pruritus Past history - Whole body pruritus for many years and now having some hyperpigmented excoriating changes on the skin.  Patient was seen by allergy in 2017 and dermatology in the past with no  identifiable triggers.  2022 skin testing showed: Positive to weed pollen, tree pollen, dust mites, cat, cockroach, dog and ragweed pollen, grass. Borderline positive to soy and almonds.  Interim history - stopped Dupixent due to blurry vision/gritty eye feeling. Did not have eyes evaluated yet as recommended. Itching is slightly worse now since stopped Dupixent. Continue proper skin care - use fragrance free and dye free products. Moisturize 1-2 times a day with triamcinolone:Eucerin mix For more than twice a day use the following: Aquaphor, Vaseline, Cerave, Cetaphil, Eucerin, Vanicream.  Itching: Take Allegra 180mg  in the morning Take Xyzal 5mg  in the evening.  Restart Dupixent injections every 2 weeks at home.  If you notice worsening eye symptoms then let me know and stop. Have eyes evaluated.   Asthma Past history - Reports shortness of breath at times but much better with Singulair and Xyzal.  He also uses Symbicort 160 mcg as needed and has been using it more frequently the last few days. 2022 spirometry shows some restriction with 30% improvement in FEV1 post bronchodilator treatment.  Clinically feeling improved. Interim history - was doing well up until 1 week ago, not sure if it's a combination of ragweed season with being off Dupixent and possibly getting URI. Today's spirometry showed mixed restriction and obstruction. Take mucinex twice a day with plenty of water.  For the next 7 days - take albuterol 2 puffs prior to using Airduo. If breathing is not improved, then start prednisone. Start prednisone taper. Prednisone 10mg  tablets - take 2 tablets for 4  days then 1 tablet on day 5.  Make sure to monitor sugars when taking prednisone.  Daily controller medication(s): Continue Singulair (montelukast) 10mg  daily at night. Take Airduo Digihaler 1 puff twice a day and rinse mouth after each use. May use albuterol rescue inhaler 2 puffs every 4 to 6 hours as needed for  shortness of breath, chest tightness, coughing, and wheezing. May use albuterol rescue inhaler 2 puffs 5 to 15 minutes prior to strenuous physical activities. Monitor frequency of use.  Get spirometry at next visit. Restart Dupixent as above.  Seasonal and perennial allergic rhinoconjunctivitis Past history - Rhinoconjunctivitis symptoms mainly in the spring.  2017 skin testing was positive to trees, cockroach, grass, cat and dust mites.  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 2022 skin testing showed: Positive to weed pollen, tree pollen, dust mites, cat, cockroach, dog and ragweed pollen, grass.  Interim history - increased rhinitis symptoms the past week.  Continue environmental control measures as below. May use over the counter antihistamines such as Zyrtec (cetirizine), Claritin (loratadine), Allegra (fexofenadine), or Xyzal (levocetirizine) daily as needed as above.  Use Flonase (fluticasone) nasal spray 1 spray per nostril twice a day as needed for nasal congestion.  Nasal saline spray (i.e., Simply Saline) or nasal saline lavage (i.e., NeilMed) is recommended as needed and prior to medicated nasal sprays. Consider allergy injections for long term control if above medications do not help the symptoms - handout given.   Return in about 3 months (around 02/18/2021).  No orders of the defined types were placed in this encounter.  Lab Orders  No laboratory test(s) ordered today    Diagnostics: Spirometry:  Tracings reviewed. His effort: Good reproducible efforts. FVC: 3.22L FEV1: 2.16L, 62% predicted FEV1/FVC ratio: 67% Interpretation: Spirometry consistent with mixed obstructive and restrictive disease.  Please see scanned spirometry results for details.  Medication List:  Current Outpatient Medications  Medication Sig Dispense Refill   albuterol (PROVENTIL HFA;VENTOLIN HFA) 108 (90 BASE) MCG/ACT inhaler Inhale 2 puffs into the lungs every 6 (six) hours  as needed for wheezing or shortness of breath.     amLODipine (NORVASC) 10 MG tablet TK 1 T PO QD  2   atorvastatin (LIPITOR) 10 MG tablet TK 1 T PO  D. GEF LIPITOR  1   Cholecalciferol (VITAMIN D-1000 MAX ST PO) Take by mouth.     diclofenac (VOLTAREN) 50 MG EC tablet Take 50 mg by mouth 2 (two) times daily.     EPINEPHrine 0.3 mg/0.3 mL IJ SOAJ injection Use as directed for life-threatening allergic reaction. 1 each 2   Fluticasone-Salmeterol,sensor, (AIRDUO DIGIHALER) 232-14 MCG/ACT AEPB Inhale 1 puff into the lungs in the morning and at bedtime. Rinse mouth after each use. 1 each 5   levocetirizine (XYZAL) 5 MG tablet Take 5 mg by mouth every evening.     losartan (COZAAR) 100 MG tablet Take 100 mg by mouth daily.  0   metFORMIN (GLUCOPHAGE-XR) 500 MG 24 hr tablet Take 500 mg by mouth at bedtime.   0   montelukast (SINGULAIR) 10 MG tablet Take 10 mg by mouth at bedtime. Reported on 06/23/2015     Omega 3-6-9 Fatty Acids (OMEGA 3-6-9 COMPLEX PO) Take by mouth.     triamcinolone ointment (KENALOG) 0.1 % Apply 1 application topically 2 (two) times daily. Use as moisturizer 453 g 1   Current Facility-Administered Medications  Medication Dose Route Frequency Provider Last Rate Last Admin  dupilumab (DUPIXENT) prefilled syringe 300 mg  300 mg Subcutaneous Once Ellamae Sia, DO       Allergies: No Known Allergies I reviewed his past medical history, social history, family history, and environmental history and no significant changes have been reported from his previous visit.  Review of Systems  Constitutional:  Negative for appetite change, chills, fever and unexpected weight change.  HENT:  Positive for congestion and rhinorrhea.   Eyes:  Negative for itching.  Respiratory:  Positive for cough. Negative for chest tightness, shortness of breath and wheezing.   Cardiovascular:  Negative for chest pain.  Gastrointestinal:  Negative for abdominal pain.  Genitourinary:  Negative for difficulty  urinating.  Skin:  Positive for rash.  Allergic/Immunologic: Positive for environmental allergies.  Neurological:  Negative for headaches.   Objective: BP 124/78   Pulse 77   Resp 16   SpO2 96%  There is no height or weight on file to calculate BMI. Physical Exam Vitals and nursing note reviewed.  Constitutional:      Appearance: Normal appearance. He is well-developed.  HENT:     Head: Normocephalic and atraumatic.     Right Ear: Tympanic membrane and external ear normal.     Left Ear: Tympanic membrane and external ear normal.     Nose: Nose normal.     Mouth/Throat:     Mouth: Mucous membranes are moist.     Pharynx: Oropharynx is clear.  Eyes:     Conjunctiva/sclera: Conjunctivae normal.  Cardiovascular:     Rate and Rhythm: Normal rate and regular rhythm.     Heart sounds: Normal heart sounds. No murmur heard.   No friction rub. No gallop.  Pulmonary:     Effort: Pulmonary effort is normal.     Breath sounds: Normal breath sounds. No wheezing, rhonchi or rales.  Musculoskeletal:     Cervical back: Neck supple.  Skin:    General: Skin is warm.     Findings: Rash present.     Comments: Improved - Circular hyperpigmented rash throughout the upper/lower extremities b/l.  Neurological:     Mental Status: He is alert and oriented to person, place, and time.  Psychiatric:        Behavior: Behavior normal.  Previous notes and tests were reviewed. The plan was reviewed with the patient/family, and all questions/concerned were addressed.  It was my pleasure to see Nathaniel Robertson today and participate in his care. Please feel free to contact me with any questions or concerns.  Sincerely,  Wyline Mood, DO Allergy & Immunology  [facility] office: [phone] [facility] office: [phone]

## 2020-11-26 ENCOUNTER — Encounter: Payer: Self-pay | Admitting: Allergy

## 2021-02-17 NOTE — Progress Notes (Signed)
Follow Up Note  RE: Nathaniel Robertson MRN: [id number] DOB: 1960-08-06 Date of Office Visit: 02/18/2021  Referring provider: Georgianne Fick, MD Primary care provider: Georgianne Fick, MD  Chief Complaint: Asthma (Once every 2-3 days ) and Pruritus (Since the weather change has had a lot of itching. The cold weather is a trigger. Has been using the cream )  History of Present Illness: I had the pleasure of seeing Nathaniel Robertson for a follow up visit at the [facility] on 02/18/2021. He is a 60 y.o. male, who is being followed for pruritus, asthma on Dupixent, allergic rhinoconjunctivitis. His previous allergy office visit was on 11/19/2020 with Dr. [name]. Today is a regular follow up visit.  Pruritus Noticed some increased itching with the cold weather. Currently self administering Dupixent injections every 2 weeks at home with no issues.  Using triamcinolone-Eucerin mix once a day. Did not notice any difference with taking or not taking Xyzal regarding the itching.   Patient had his eyes checked and was told it was normal but he notices some blurry vision in the mornings.  Using a back scratcher to itch his back at times.   Asthma Patient had whole body MRI last year but wonders if he needs a CT chest as he is having a dry cough at times. Patient is an ex-smoker. Offered a Chest X-ray as I don't think insurance will cover the CT chest - patient declined.   He also stopped using his maintenance inhalers and not sure if the dry cough/shortness of breath is worse since doing that.  He did take the prednisone that was given to him at the last visit.   Seasonal and perennial allergic rhinoconjunctivitis Currently using Flonase and not sure if it's helping. Having some PND. He was taking Xyzal daily.   Wants to wait regarding the AIT.  Gave list of insurance we take in our office as he needs to sign up for new insurance next year.  Assessment and Plan: Nathaniel Robertson is  a 60 y.o. male with: Pruritus Past history - Whole body pruritus for many years and now having some hyperpigmented excoriating changes on the skin.  Patient was seen by allergy in 2017 and dermatology in the past with no identifiable triggers.  2022 skin testing showed: Positive to weed pollen, tree pollen, dust mites, cat, cockroach, dog and ragweed pollen, grass. Borderline positive to soy and almonds.  Interim history - restarted Dupixent, had eyes checked with no issues. Itching worse with cold weather.  Use a cool mist humidifier during the winter months. Continue proper skin care - use fragrance free and dye free products. Moisturize 1-2 times a day with triamcinolone:Eucerin mix For more than twice a day use the following: Aquaphor, Vaseline, Cerave, Cetaphil, Eucerin, Vanicream.  Itching: Take hydroxyzine  1 hour before bedtime as needed for the itching.  Continue Dupixent injections every 2 weeks at home.  Make sure your new insurance covers the Dupixent.   Asthma Past history - Reports shortness of breath at times but much better with Singulair and Xyzal.  He also uses Symbicort 160 mcg as needed and has been using it more frequently the last few days. 2022 spirometry shows some restriction with 30% improvement in FEV1 post bronchodilator treatment.  Clinically feeling improved. Interim history - took prednisone and still has some coughing. He is not sure if it's due to him stopping his maintenance inhaler vs PND. Asking about getting CT chest but patient apparently had  MRI last year with no lung issues.  Today's spirometry showed some restriction worse than previous one.  Monitor coughing. Daily controller medication(s): Continue Singulair (montelukast) 10mg  daily at night. TAKE Airduo Digihaler 1 puff twice a day and rinse mouth after each use. May use albuterol rescue inhaler 2 puffs every 4 to 6 hours as needed for shortness of breath, chest tightness, coughing, and  wheezing. May use albuterol rescue inhaler 2 puffs 5 to 15 minutes prior to strenuous physical activities. Monitor frequency of use.  Get spirometry at next visit. Continue with Dupixent as above.  Seasonal and perennial allergic rhinoconjunctivitis Past history - Rhinoconjunctivitis symptoms mainly in the spring.  2017 skin testing was positive to trees, cockroach, grass, cat and dust mites.  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 2022 skin testing showed: Positive to weed pollen, tree pollen, dust mites, cat, cockroach, dog and ragweed pollen, grass.  Interim history - increased PND, not sure if wants to start AIT.  Continue environmental control measures as below. Continue Singulair (montelukast) 10mg  daily at night. May use over the counter antihistamines such as Zyrtec (cetirizine), Claritin (loratadine), Allegra (fexofenadine), or Xyzal (levocetirizine) daily as needed as above.  Start Ryaltris (olopatadine + mometasone nasal spray combination) 1-2 sprays per nostril twice a day. Sample given. Nasal saline spray (i.e., Simply Saline) or nasal saline lavage (i.e., NeilMed) is recommended as needed and prior to medicated nasal sprays. Consider allergy injections for long term control if above medications do not help the symptoms.  Return in about 3 months (around 05/19/2021).  Meds ordered this encounter  Medications   hydrOXYzine (ATARAX) 25 MG tablet    Sig: Take 1 tablet (25 mg total) by mouth at bedtime as needed for itching.    Dispense:  30 tablet    Refill:  2   montelukast (SINGULAIR) 10 MG tablet    Sig: Take 1 tablet (10 mg total) by mouth at bedtime.    Dispense:  90 tablet    Refill:  3   Olopatadine-Mometasone (RYALTRIS) 665-25 MCG/ACT SUSP    Sig: Place 1-2 sprays into the nose 2 (two) times daily.    Dispense:  29 g    Refill:  5    Lab Orders  No laboratory test(s) ordered today    Diagnostics: Spirometry:  Tracings reviewed. His  effort: Good reproducible efforts. FVC: 2.44L FEV1: 1.66L, 50% predicted FEV1/FVC ratio: 68% Interpretation: Spirometry consistent with possible restrictive disease.  Please see scanned spirometry results for details.  Medication List:  Current Outpatient Medications  Medication Sig Dispense Refill   albuterol (PROVENTIL HFA;VENTOLIN HFA) 108 (90 BASE) MCG/ACT inhaler Inhale 2 puffs into the lungs every 6 (six) hours as needed for wheezing or shortness of breath.     amLODipine (NORVASC) 10 MG tablet TK 1 T PO QD  2   atorvastatin (LIPITOR) 10 MG tablet TK 1 T PO  D. GEF LIPITOR  1   Cholecalciferol (VITAMIN D-1000 MAX ST PO) Take by mouth.     diclofenac (VOLTAREN) 50 MG EC tablet Take 50 mg by mouth 2 (two) times daily.     EPINEPHrine 0.3 mg/0.3 mL IJ SOAJ injection Use as directed for life-threatening allergic reaction. 1 each 2   Fluticasone-Salmeterol,sensor, (AIRDUO DIGIHALER) 232-14 MCG/ACT AEPB Inhale 1 puff into the lungs in the morning and at bedtime. Rinse mouth after each use. 1 each 5   hydrOXYzine (ATARAX) 25 MG tablet Take 1 tablet (25  mg total) by mouth at bedtime as needed for itching. 30 tablet 2   levocetirizine (XYZAL) 5 MG tablet Take 5 mg by mouth every evening.     losartan (COZAAR) 100 MG tablet Take 100 mg by mouth daily.  0   metFORMIN (GLUCOPHAGE-XR) 500 MG 24 hr tablet Take 500 mg by mouth at bedtime.   0   montelukast (SINGULAIR) 10 MG tablet Take 1 tablet (10 mg total) by mouth at bedtime. 90 tablet 3   Olopatadine-Mometasone (RYALTRIS) 665-25 MCG/ACT SUSP Place 1-2 sprays into the nose 2 (two) times daily. 29 g 5   Omega 3-6-9 Fatty Acids (OMEGA 3-6-9 COMPLEX PO) Take by mouth.     triamcinolone ointment (KENALOG) 0.1 % Apply 1 application topically 2 (two) times daily. Use as moisturizer 453 g 1   Current Facility-Administered Medications  Medication Dose Route Frequency Provider Last Rate Last Admin   dupilumab (DUPIXENT) prefilled syringe 300 mg  300 mg  Subcutaneous Once Ellamae Sia, DO       Allergies: No Known Allergies I reviewed his past medical history, social history, family history, and environmental history and no significant changes have been reported from his previous visit.  Review of Systems  Constitutional:  Negative for appetite change, chills, fever and unexpected weight change.  HENT:  Positive for postnasal drip and rhinorrhea.   Eyes:  Negative for itching.  Respiratory:  Positive for cough. Negative for chest tightness, shortness of breath and wheezing.   Cardiovascular:  Negative for chest pain.  Gastrointestinal:  Negative for abdominal pain.  Genitourinary:  Negative for difficulty urinating.  Skin:  Positive for rash.       Itchy skin  Allergic/Immunologic: Positive for environmental allergies.  Neurological:  Negative for headaches.   Objective: BP 130/82   Pulse 73   Temp 97.6 F (36.4 C)   Resp 16   Ht 5\' 9"  (1.753 m)   Wt 241 lb (109.3 kg)   SpO2 97%   BMI 35.59 kg/m  Body mass index is 35.59 kg/m. Physical Exam Vitals and nursing note reviewed.  Constitutional:      Appearance: Normal appearance. He is well-developed.  HENT:     Head: Normocephalic and atraumatic.     Right Ear: Tympanic membrane and external ear normal.     Left Ear: Tympanic membrane and external ear normal.     Nose: Nose normal.     Mouth/Throat:     Mouth: Mucous membranes are moist.     Pharynx: Oropharynx is clear.  Eyes:     Conjunctiva/sclera: Conjunctivae normal.  Cardiovascular:     Rate and Rhythm: Normal rate and regular rhythm.     Heart sounds: Normal heart sounds. No murmur heard.   No friction rub. No gallop.  Pulmonary:     Effort: Pulmonary effort is normal.     Breath sounds: Normal breath sounds. No wheezing, rhonchi or rales.  Musculoskeletal:     Cervical back: Neck supple.  Skin:    General: Skin is warm.     Findings: Rash present.     Comments: Improved - Circular hyperpigmented rash  throughout the upper/lower extremities b/l.  Neurological:     Mental Status: He is alert and oriented to person, place, and time.  Psychiatric:        Behavior: Behavior normal.   Previous notes and tests were reviewed. The plan was reviewed with the patient/family, and all questions/concerned were addressed.  It was my pleasure to  see Kaulin today and participate in his care. Please feel free to contact me with any questions or concerns.  Sincerely,  Wyline Mood, DO Allergy & Immunology  [facility] office: [phone] [facility] office: [phone]

## 2021-02-18 ENCOUNTER — Other Ambulatory Visit: Payer: Self-pay

## 2021-02-18 ENCOUNTER — Encounter: Payer: Self-pay | Admitting: Allergy

## 2021-02-18 ENCOUNTER — Ambulatory Visit (INDEPENDENT_AMBULATORY_CARE_PROVIDER_SITE_OTHER): Payer: 59 | Admitting: Allergy

## 2021-02-18 VITALS — BP 130/82 | HR 73 | Temp 97.6°F | Resp 16 | Ht 69.0 in | Wt 241.0 lb

## 2021-02-18 DIAGNOSIS — L299 Pruritus, unspecified: Secondary | ICD-10-CM | POA: Diagnosis not present

## 2021-02-18 DIAGNOSIS — J302 Other seasonal allergic rhinitis: Secondary | ICD-10-CM

## 2021-02-18 DIAGNOSIS — J4550 Severe persistent asthma, uncomplicated: Secondary | ICD-10-CM

## 2021-02-18 DIAGNOSIS — J3089 Other allergic rhinitis: Secondary | ICD-10-CM

## 2021-02-18 DIAGNOSIS — H1010 Acute atopic conjunctivitis, unspecified eye: Secondary | ICD-10-CM

## 2021-02-18 DIAGNOSIS — H1013 Acute atopic conjunctivitis, bilateral: Secondary | ICD-10-CM

## 2021-02-18 MED ORDER — HYDROXYZINE HCL 25 MG PO TABS: 25.0000 mg | ORAL_TABLET | Freq: Every evening | ORAL | 2 refills | Status: AC | PRN

## 2021-02-18 MED ORDER — MONTELUKAST SODIUM 10 MG PO TABS
10.0000 mg | ORAL_TABLET | Freq: Every day | ORAL | 3 refills | Status: DC
Start: 2021-02-18 — End: 2022-04-22

## 2021-02-18 MED ORDER — RYALTRIS 665-25 MCG/ACT NA SUSP: 1.0000 | Freq: Two times a day (BID) | NASAL | 5 refills | Status: DC

## 2021-02-18 NOTE — Assessment & Plan Note (Signed)
Past history - Whole body pruritus for many years and now having some hyperpigmented excoriating changes on the skin.  Patient was seen by allergy in 2017 and dermatology in the past with no identifiable triggers.  2022 skin testing showed: Positive to weed pollen, tree pollen, dust mites, cat, cockroach, dog and ragweed pollen, grass. Borderline positive to soy and almonds.  Interim history - restarted Dupixent, had eyes checked with no issues. Itching worse with cold weather.   Use a cool mist humidifier during the winter months.  Continue proper skin care - use fragrance free and dye free products.  Moisturize 1-2 times a day with triamcinolone:Eucerin mix . For more than twice a day use the following: Aquaphor, Vaseline, Cerave, Cetaphil, Eucerin, Vanicream.  Itching: . Take hydroxyzine 25mg  1 hour before bedtime as needed for the itching.   Continue Dupixent injections every 2 weeks at home.   Make sure your new insurance covers the Dupixent.

## 2021-02-18 NOTE — Patient Instructions (Addendum)
Skin: Use a cool mist humidifier during the winter months.  Continue proper skin care - use fragrance free and dye free products. Moisturize 1-2 times a day with triamcinolone:Eucerin mix For more than twice a day use the following: Aquaphor, Vaseline, Cerave, Cetaphil, Eucerin, Vanicream.  Itching: Take hydroxyzine 25mg  1 hour before bedtime as needed for the itching.  Continue Dupixent injections every 2 weeks at home.   Environmental allergies 2022 skin testing positive to weed pollen, tree pollen, dust mites, cat, cockroach, dog and ragweed pollen, grass.  Continue environmental control measures as below. Continue Singulair (montelukast) 10mg  daily at night. May use over the counter antihistamines such as Zyrtec (cetirizine), Claritin (loratadine), Allegra (fexofenadine), or Xyzal (levocetirizine) daily as needed as above.  Start Ryaltris (olopatadine + mometasone nasal spray combination) 1-2 sprays per nostril twice a day. Sample given.  Nasal saline spray (i.e., Simply Saline) or nasal saline lavage (i.e., NeilMed) is recommended as needed and prior to medicated nasal sprays. Consider allergy injections for long term control if above medications do not help the symptoms.  Breathing: Breathing test was not as good as the last time. Monitor coughing. Daily controller medication(s): Continue Singulair (montelukast) 10mg  daily at night. Take Airduo Digihaler 2023 1 puff twice a day and rinse mouth after each use. May use albuterol rescue inhaler 2 puffs every 4 to 6 hours as needed for shortness of breath, chest tightness, coughing, and wheezing. May use albuterol rescue inhaler 2 puffs 5 to 15 minutes prior to strenuous physical activities. Monitor frequency of use.  Breathing control goals:  Full participation in all desired activities (may need albuterol before activity) Albuterol use two times or less a week on average (not counting use with activity) Cough interfering with sleep  two times or less a month Oral steroids no more than once a year No hospitalizations  Follow up in 3 months or sooner if needed.   Skin care recommendations  Bath time: Always use lukewarm water. AVOID very hot or cold water. Keep bathing time to 5-10 minutes. Do NOT use bubble bath. Use a mild soap and use just enough to wash the dirty areas. Do NOT scrub skin vigorously.  After bathing, pat dry your skin with a towel. Do NOT rub or scrub the skin.  Moisturizers and prescriptions:  ALWAYS apply moisturizers immediately after bathing (within 3 minutes). This helps to lock-in moisture. Use the moisturizer several times a day over the whole body. Good summer moisturizers include: Aveeno, CeraVe, Cetaphil. Good winter moisturizers include: Aquaphor, Vaseline, Cerave, Cetaphil, Eucerin, Vanicream. When using moisturizers along with medications, the moisturizer should be applied about one hour after applying the medication to prevent diluting effect of the medication or moisturize around where you applied the medications. When not using medications, the moisturizer can be continued twice daily as maintenance.  Laundry and clothing: Avoid laundry products with added color or perfumes. Use unscented hypo-allergenic laundry products such as Tide free, Cheer free & gentle, and All free and clear.  If the skin still seems dry or sensitive, you can try double-rinsing the clothes. Avoid tight or scratchy clothing such as wool. Do not use fabric softeners or dyer sheets.  Reducing Pollen Exposure Pollen seasons: trees (spring), grass (summer) and ragweed/weeds (fall). Keep windows closed in your home and car to lower pollen exposure.  Install air conditioning in the bedroom and throughout the house if possible.  Avoid going out in dry windy days - especially early morning. Pollen counts are highest between  5 - 10 AM and on dry, hot and windy days.  Save outside activities for late afternoon or  after a heavy rain, when pollen levels are lower.  Avoid mowing of grass if you have grass pollen allergy. Be aware that pollen can also be transported indoors on people and pets.  Dry your clothes in an automatic dryer rather than hanging them outside where they might collect pollen.  Rinse hair and eyes before bedtime. Control of House Dust Mite Allergen Dust mite allergens are a common trigger of allergy and asthma symptoms. While they can be found throughout the house, these microscopic creatures thrive in warm, humid environments such as bedding, upholstered furniture and carpeting. Because so much time is spent in the bedroom, it is essential to reduce mite levels there.  Encase pillows, mattresses, and box springs in special allergen-proof fabric covers or airtight, zippered plastic covers.  Bedding should be washed weekly in hot water (130 F) and dried in a hot dryer. Allergen-proof covers are available for comforters and pillows that can't be regularly washed.  Wash the allergy-proof covers every few months. Minimize clutter in the bedroom. Keep pets out of the bedroom.  Keep humidity less than 50% by using a dehumidifier or air conditioning. You can buy a humidity measuring device called a hygrometer to monitor this.  If possible, replace carpets with hardwood, linoleum, or washable area rugs. If that's not possible, vacuum frequently with a vacuum that has a HEPA filter. Remove all upholstered furniture and non-washable window drapes from the bedroom. Remove all non-washable stuffed toys from the bedroom.  Wash stuffed toys weekly. Pet Allergen Avoidance: Contrary to popular opinion, there are no "hypoallergenic" breeds of dogs or cats. That is because people are not allergic to an animal's hair, but to an allergen found in the animal's saliva, dander (dead skin flakes) or urine. Pet allergy symptoms typically occur within minutes. For some people, symptoms can build up and become most  severe 8 to 12 hours after contact with the animal. People with severe allergies can experience reactions in public places if dander has been transported on the pet owners' clothing. Keeping an animal outdoors is only a partial solution, since homes with pets in the yard still have higher concentrations of animal allergens. Before getting a pet, ask your allergist to determine if you are allergic to animals. If your pet is already considered part of your family, try to minimize contact and keep the pet out of the bedroom and other rooms where you spend a great deal of time. As with dust mites, vacuum carpets often or replace carpet with a hardwood floor, tile or linoleum. High-efficiency particulate air (HEPA) cleaners can reduce allergen levels over time. While dander and saliva are the source of cat and dog allergens, urine is the source of allergens from rabbits, hamsters, mice and [place] pigs; so ask a non-allergic family member to clean the animal's cage. If you have a pet allergy, talk to your allergist about the potential for allergy immunotherapy (allergy shots). This strategy can often provide long-term relief.

## 2021-02-18 NOTE — Assessment & Plan Note (Addendum)
Past history - Rhinoconjunctivitis symptoms mainly in the spring.  2017 skin testing was positive to trees, cockroach, grass, cat and dust mites.  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 2022 skin testing showed: Positive to weed pollen, tree pollen, dust mites, cat, cockroach, dog and ragweed pollen, grass.  Interim history - increased PND, not sure if wants to start AIT.   Continue environmental control measures as below.  Continue Singulair (montelukast) 10mg  daily at night.  May use over the counter antihistamines such as Zyrtec (cetirizine), Claritin (loratadine), Allegra (fexofenadine), or Xyzal (levocetirizine) daily as needed as above.  . Start Ryaltris (olopatadine + mometasone nasal spray combination) 1-2 sprays per nostril twice a day. Sample given.  Nasal saline spray (i.e., Simply Saline) or nasal saline lavage (i.e., NeilMed) is recommended as needed and prior to medicated nasal sprays.  Consider allergy injections for long term control if above medications do not help the symptoms.

## 2021-02-18 NOTE — Assessment & Plan Note (Signed)
Past history - Reports shortness of breath at times but much better with Singulair and Xyzal.  He also uses Symbicort 160 mcg as needed and has been using it more frequently the last few days. 2022 spirometry shows some restriction with 30% improvement in FEV1 post bronchodilator treatment.  Clinically feeling improved. Interim history - took prednisone and still has some coughing. He is not sure if it's due to him stopping his maintenance inhaler vs PND. Asking about getting CT chest but patient apparently had MRI last year with no lung issues.   Today's spirometry showed some restriction worse than previous one.  . Monitor coughing. . Daily controller medication(s): Continue Singulair (montelukast) 10mg  daily at night. TAKE Airduo Digihaler Marland Kitchen 1 puff twice a day and rinse mouth after each use. . May use albuterol rescue inhaler 2 puffs every 4 to 6 hours as needed for shortness of breath, chest tightness, coughing, and wheezing. May use albuterol rescue inhaler 2 puffs 5 to 15 minutes prior to strenuous physical activities. Monitor frequency of use.  . Get spirometry at next visit. . Continue with Dupixent as above.

## 2021-05-19 NOTE — Progress Notes (Signed)
Follow Up Note  RE: Nathaniel Robertson MRN: [id number] DOB: Feb 17, 1961 Date of Office Visit: 05/20/2021  Referring provider: Merrilee Seashore, MD Primary care provider: Merrilee Seashore, MD  Chief Complaint: Asthma  History of Present Illness: I had the pleasure of seeing Nathaniel Robertson for a follow up visit at the [facility] [place] [place] on 05/20/2021. He is a 61 y.o. male, who is being followed for pruritus, asthma on Dupixent, allergic rhinoconjunctivitis. His previous allergy office visit was on 02/18/2021 with Dr. Maudie Mercury. Today is a regular follow up visit.  Pruritus Itching resolved with Dupixent every 2 weeks at home. Sometimes late for dose with no flare in symptoms.   Using triamcinolone/Eucerin once a day after showering with good benefit. Takes hydroxyzine and Xyzal at night. Stopped morning antihistamine dose. Hydroxyzine helps him sleep at night as well.    Asthma Currently on Singulair 10mg  daily, Airduo 232 1 puff twice a day with good benefit.  Denies any SOB, coughing, wheezing, chest tightness, nocturnal awakenings, ER/urgent care visits or prednisone use since the last visit.  Taking Dupixent every 2 weeks at home with no issues.   Seasonal and perennial allergic rhinoconjunctivitis Uses Ryaltris as needed and using saline rinse as needed. Symptoms controlled.   Assessment and Plan: Nathaniel Robertson is a 61 y.o. male with: Asthma Past history - Reports shortness of breath at times but much better with Singulair and Xyzal.  He also uses Symbicort 160 mcg as needed and has been using it more frequently the last few days. 2022 spirometry shows some restriction with 30% improvement in FEV1 post bronchodilator treatment.  Clinically feeling improved. Interim history - taking Airduo daily with good benefit.  Today's spirometry was normal.  Daily controller medication(s):  Continue Singulair (montelukast) 10mg  daily at night. Continue Airduo Digihaler 240mcg 1 puff  twice a day and rinse mouth after each use. May use albuterol rescue inhaler 2 puffs every 4 to 6 hours as needed for shortness of breath, chest tightness, coughing, and wheezing. May use albuterol rescue inhaler 2 puffs 5 to 15 minutes prior to strenuous physical activities. Monitor frequency of use.  Get spirometry at next visit. Continue with Dupixent as below.  Pruritus Past history - Whole body pruritus for many years and now having some hyperpigmented excoriating changes on the skin.  Patient was seen by allergy in 2017 and dermatology in the past with no identifiable triggers.  2022 skin testing showed: Positive to weed pollen, tree pollen, dust mites, cat, cockroach, dog and ragweed pollen, grass. Borderline positive to soy and almonds.  Interim history - resolved itching, skin is doing much better.  Continue proper skin care - use fragrance free and dye free products. Moisturize once day with triamcinolone:Eucerin mix Take hydroxyzine 25mg  1 hour before bedtime ONLY as needed for the itching.  Continue Dupixent injections every 2 to 4 weeks at home.   Seasonal and perennial allergic rhinoconjunctivitis Past history - Rhinoconjunctivitis symptoms mainly in the spring.  2017 skin testing was positive to trees, cockroach, grass, cat and dust mites.  Patient was on AIT in [place] for 2 years with some benefit. 2022 skin testing showed: Positive to weed pollen, tree pollen, dust mites, cat, cockroach, dog and ragweed pollen, grass.  Interim history - controlled symptoms.  Continue environmental control measures.  Continue Singulair (montelukast) 10mg  daily at night. May use over the counter antihistamines such as Zyrtec (cetirizine), Claritin (loratadine), Allegra (fexofenadine), or Xyzal (levocetirizine) daily as needed as above.  May use Ryaltris (olopatadine + mometasone nasal spray combination) 1-2 sprays per nostril twice a day as needed.  Nasal saline spray (i.e., Simply Saline) or  nasal saline lavage (i.e., NeilMed) is recommended as needed and prior to medicated nasal sprays. Consider allergy injections for long term control if above medications do not help the symptoms.  Return in about 6 months (around 11/20/2021).  No orders of the defined types were placed in this encounter.  Lab Orders  No laboratory test(s) ordered today    Diagnostics: Spirometry:  Tracings reviewed. His effort: Good reproducible efforts. FVC: 3.22L FEV1: 2.25L, 72% predicted FEV1/FVC ratio: 70% Interpretation: Spirometry consistent with normal pattern.  Please see scanned spirometry results for details.  Medication List:  Current Outpatient Medications  Medication Sig Dispense Refill   albuterol (PROVENTIL HFA;VENTOLIN HFA) 108 (90 BASE) MCG/ACT inhaler Inhale 2 puffs into the lungs every 6 (six) hours as needed for wheezing or shortness of breath.     amLODipine (NORVASC) 10 MG tablet TK 1 T PO QD  2   atorvastatin (LIPITOR) 10 MG tablet TK 1 T PO  D. GEF LIPITOR  1   Cholecalciferol (VITAMIN D-1000 MAX ST PO) Take by mouth.     diclofenac (VOLTAREN) 50 MG EC tablet Take 50 mg by mouth 2 (two) times daily.     EPINEPHrine 0.3 mg/0.3 mL IJ SOAJ injection Use as directed for life-threatening allergic reaction. 1 each 2   Fluticasone-Salmeterol,sensor, (AIRDUO DIGIHALER) 232-14 MCG/ACT AEPB Inhale 1 puff into the lungs in the morning and at bedtime. Rinse mouth after each use. 1 each 5   hydrOXYzine (ATARAX) 25 MG tablet Take 1 tablet (25 mg total) by mouth at bedtime as needed for itching. 30 tablet 2   levocetirizine (XYZAL) 5 MG tablet Take 5 mg by mouth every evening.     losartan (COZAAR) 100 MG tablet Take 100 mg by mouth daily.  0   metFORMIN (GLUCOPHAGE-XR) 500 MG 24 hr tablet Take 500 mg by mouth at bedtime.   0   montelukast (SINGULAIR) 10 MG tablet Take 1 tablet (10 mg total) by mouth at bedtime. 90 tablet 3   Olopatadine-Mometasone (RYALTRIS) 665-25 MCG/ACT SUSP Place 1-2  sprays into the nose 2 (two) times daily. 29 g 5   Omega 3-6-9 Fatty Acids (OMEGA 3-6-9 COMPLEX PO) Take by mouth.     triamcinolone ointment (KENALOG) 0.1 % Apply 1 application topically 2 (two) times daily. Use as moisturizer 453 g 1   Current Facility-Administered Medications  Medication Dose Route Frequency Provider Last Rate Last Admin   dupilumab (DUPIXENT) prefilled syringe 300 mg  300 mg Subcutaneous Once Ellamae Sia, DO       Allergies: No Known Allergies I reviewed his past medical history, social history, family history, and environmental history and no significant changes have been reported from his previous visit.  Review of Systems  Constitutional:  Negative for appetite change, chills, fever and unexpected weight change.  HENT:  Negative for postnasal drip and rhinorrhea.   Eyes:  Negative for itching.  Respiratory:  Negative for cough, chest tightness, shortness of breath and wheezing.   Cardiovascular:  Negative for chest pain.  Gastrointestinal:  Negative for abdominal pain.  Genitourinary:  Negative for difficulty urinating.  Skin:  Negative for rash.  Allergic/Immunologic: Positive for environmental allergies.  Neurological:  Negative for headaches.   Objective: BP 118/82    Pulse 79    Temp (!) 97.4 F (36.3 C) (Temporal)  Resp 16    SpO2 97%  There is no height or weight on file to calculate BMI. Physical Exam Vitals and nursing note reviewed.  Constitutional:      Appearance: Normal appearance. He is well-developed.  HENT:     Head: Normocephalic and atraumatic.     Right Ear: Tympanic membrane and external ear normal.     Left Ear: Tympanic membrane and external ear normal.     Nose: Nose normal.     Mouth/Throat:     Mouth: Mucous membranes are moist.     Pharynx: Oropharynx is clear.  Eyes:     Conjunctiva/sclera: Conjunctivae normal.  Cardiovascular:     Rate and Rhythm: Normal rate and regular rhythm.     Heart sounds: Normal heart sounds. No  murmur heard.   No friction rub. No gallop.  Pulmonary:     Effort: Pulmonary effort is normal.     Breath sounds: Normal breath sounds. No wheezing, rhonchi or rales.  Musculoskeletal:     Cervical back: Neck supple.  Skin:    General: Skin is warm.     Findings: No rash.     Comments: Circular scarring on upper and lower extremities b/l.  Neurological:     Mental Status: He is alert and oriented to person, place, and time.  Psychiatric:        Behavior: Behavior normal.   Previous notes and tests were reviewed. The plan was reviewed with the patient/family, and all questions/concerned were addressed.  It was my pleasure to see Nathaniel Robertson today and participate in his care. Please feel free to contact me with any questions or concerns.  Sincerely,  Rexene Alberts, DO Allergy & Immunology  [facility] office: [place] office: [phone]

## 2021-05-20 ENCOUNTER — Ambulatory Visit: Payer: 59 | Admitting: Allergy

## 2021-05-20 ENCOUNTER — Encounter: Payer: Self-pay | Admitting: Allergy

## 2021-05-20 ENCOUNTER — Other Ambulatory Visit: Payer: Self-pay

## 2021-05-20 VITALS — BP 118/82 | HR 79 | Temp 97.4°F | Resp 16

## 2021-05-20 DIAGNOSIS — H1010 Acute atopic conjunctivitis, unspecified eye: Secondary | ICD-10-CM

## 2021-05-20 DIAGNOSIS — H1013 Acute atopic conjunctivitis, bilateral: Secondary | ICD-10-CM

## 2021-05-20 DIAGNOSIS — L299 Pruritus, unspecified: Secondary | ICD-10-CM

## 2021-05-20 DIAGNOSIS — J4550 Severe persistent asthma, uncomplicated: Secondary | ICD-10-CM

## 2021-05-20 DIAGNOSIS — J3089 Other allergic rhinitis: Secondary | ICD-10-CM

## 2021-05-20 NOTE — Assessment & Plan Note (Signed)
Past history - Whole body pruritus for many years and now having some hyperpigmented excoriating changes on the skin.  Patient was seen by allergy in 2017 and dermatology in the past with no identifiable triggers.  2022 skin testing showed: Positive to weed pollen, tree pollen, dust mites, cat, cockroach, dog and ragweed pollen, grass. Borderline positive to soy and almonds.  ?Interim history - resolved itching, skin is doing much better.  ?? Continue proper skin care - use fragrance free and dye free products. ?? Moisturize once day with triamcinolone:Eucerin mix ?? Take hydroxyzine 25mg  1 hour before bedtime ONLY as needed for the itching.  ?? Continue Dupixent injections every 2 to 4 weeks at home.  ?

## 2021-05-20 NOTE — Patient Instructions (Addendum)
Skin: ?Continue proper skin care - use fragrance free and dye free products. ?Moisturize once day with triamcinolone:Eucerin mix ?Take hydroxyzine 25mg  1 hour before bedtime ONLY as needed for the itching.  ?Continue Dupixent injections every 2 to 4 weeks at home.  ? ?Environmental allergies ?2022 skin testing positive to weed pollen, tree pollen, dust mites, cat, cockroach, dog and ragweed pollen, grass.  ?Continue environmental control measures. ?Continue Singulair (montelukast) 10mg  daily at night. ?May use over the counter antihistamines such as Zyrtec (cetirizine), Claritin (loratadine), Allegra (fexofenadine), or Xyzal (levocetirizine) daily as needed as above.  ?May use Ryaltris (olopatadine + mometasone nasal spray combination) 1-2 sprays per nostril twice a day as needed.  ?Nasal saline spray (i.e., Simply Saline) or nasal saline lavage (i.e., NeilMed) is recommended as needed and prior to medicated nasal sprays. ?Consider allergy injections for long term control if above medications do not help the symptoms. ? ?Breathing: ?Breathing test was normal today.  ?Daily controller medication(s): Continue Singulair (montelukast) 10mg  daily at night. ?Continue Airduo Digihaler 2023 1 puff twice a day and rinse mouth after each use. ?May use albuterol rescue inhaler 2 puffs every 4 to 6 hours as needed for shortness of breath, chest tightness, coughing, and wheezing. May use albuterol rescue inhaler 2 puffs 5 to 15 minutes prior to strenuous physical activities. Monitor frequency of use.  ?Breathing control goals:  ?Full participation in all desired activities (may need albuterol before activity) ?Albuterol use two times or less a week on average (not counting use with activity) ?Cough interfering with sleep two times or less a month ?Oral steroids no more than once a year ?No hospitalizations ? ?Follow up in 6 months or sooner if needed.  ?

## 2021-05-20 NOTE — Assessment & Plan Note (Signed)
Past history - Reports shortness of breath at times but much better with Singulair and Xyzal.  He also uses Symbicort 160 mcg as needed and has been using it more frequently the last few days. 2022 spirometry shows some restriction with 30% improvement in FEV1 post bronchodilator treatment.  Clinically feeling improved. ?Interim history - taking Airduo daily with good benefit.  ?? Today's spirometry was normal.  ?? Daily controller medication(s):  ? Continue Singulair (montelukast) 10mg  daily at night. ? Continue Airduo Digihaler 1 puff twice a day and rinse mouth after each use. ?? May use albuterol rescue inhaler 2 puffs every 4 to 6 hours as needed for shortness of breath, chest tightness, coughing, and wheezing. May use albuterol rescue inhaler 2 puffs 5 to 15 minutes prior to strenuous physical activities. Monitor frequency of use.  ?? Get spirometry at next visit. ?? Continue with Dupixent as below. ?

## 2021-05-20 NOTE — Assessment & Plan Note (Signed)
Past history - Rhinoconjunctivitis symptoms mainly in the spring.  2017 skin testing was positive to trees, cockroach, grass, cat and dust mites.  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 2022 skin testing showed: Positive to weed pollen, tree pollen, dust mites, cat, cockroach, dog and ragweed pollen, grass.  ?Interim history - controlled symptoms.  ?? Continue environmental control measures.  ?? Continue Singulair (montelukast) 10mg  daily at night. ?? May use over the counter antihistamines such as Zyrtec (cetirizine), Claritin (loratadine), Allegra (fexofenadine), or Xyzal (levocetirizine) daily as needed as above.  ?? May use Ryaltris (olopatadine + mometasone nasal spray combination) 1-2 sprays per nostril twice a day as needed.  ?? Nasal saline spray (i.e., Simply Saline) or nasal saline lavage (i.e., NeilMed) is recommended as needed and prior to medicated nasal sprays. ?? Consider allergy injections for long term control if above medications do not help the symptoms. ?

## 2021-06-30 ENCOUNTER — Other Ambulatory Visit: Payer: Self-pay

## 2021-06-30 ENCOUNTER — Emergency Department (HOSPITAL_BASED_OUTPATIENT_CLINIC_OR_DEPARTMENT_OTHER)
Admission: EM | Admit: 2021-06-30 | Discharge: 2021-07-01 | Disposition: A | Discharge status: Home / Self Care | Payer: 59 | Attending: Emergency Medicine | Admitting: Emergency Medicine

## 2021-06-30 DIAGNOSIS — Z21 Asymptomatic human immunodeficiency virus [HIV] infection status: Secondary | ICD-10-CM | POA: Diagnosis not present

## 2021-06-30 DIAGNOSIS — Z7951 Long term (current) use of inhaled steroids: Secondary | ICD-10-CM | POA: Diagnosis not present

## 2021-06-30 DIAGNOSIS — Z23 Encounter for immunization: Secondary | ICD-10-CM | POA: Insufficient documentation

## 2021-06-30 DIAGNOSIS — I10 Essential (primary) hypertension: Secondary | ICD-10-CM | POA: Diagnosis not present

## 2021-06-30 DIAGNOSIS — W461XXA Contact with contaminated hypodermic needle, initial encounter: Secondary | ICD-10-CM | POA: Diagnosis not present

## 2021-06-30 DIAGNOSIS — Z20828 Contact with and (suspected) exposure to other viral communicable diseases: Secondary | ICD-10-CM | POA: Insufficient documentation

## 2021-06-30 DIAGNOSIS — Z79899 Other long term (current) drug therapy: Secondary | ICD-10-CM | POA: Insufficient documentation

## 2021-06-30 DIAGNOSIS — J45909 Unspecified asthma, uncomplicated: Secondary | ICD-10-CM | POA: Insufficient documentation

## 2021-06-30 DIAGNOSIS — R69 Illness, unspecified: Secondary | ICD-10-CM | POA: Diagnosis not present

## 2021-06-30 DIAGNOSIS — Z7721 Contact with and (suspected) exposure to potentially hazardous body fluids: Secondary | ICD-10-CM | POA: Diagnosis not present

## 2021-06-30 LAB — RAPID HIV SCREEN (HIV 1/2 AB+AG)
HIV 1/2 Antibodies: NONREACTIVE
HIV-1 P24 Antigen - HIV24: NONREACTIVE

## 2021-06-30 MED ORDER — HEPATITIS B VAC RECOMBINANT 10 MCG/ML IJ SUSP
1.0000 mL | Freq: Once | INTRAMUSCULAR | Status: AC
  Administered 2021-07-01: 10 ug via INTRAMUSCULAR
  Filled 2021-06-30: qty 1

## 2021-06-30 MED ORDER — HEPATITIS B IMMUNE GLOBULIN IM SOLN
0.0600 mL/kg | Freq: Once | INTRAMUSCULAR | Status: AC
  Administered 2021-07-01: 5.6 mL via INTRAMUSCULAR
  Filled 2021-06-30: qty 6

## 2021-06-30 NOTE — Discharge Instructions (Signed)
Since we do not know if you have been adequately immunized against hepatitis B we will go ahead and give you your first hepatitis B vaccination and immunoglobulin.  If your hepatitis B serum antibody level shows adequate immunity, you will not need any additional immunizations.  If the antibody level shows inadequate immunity, you will need two additional hepatitis B vaccines in 1 month and in 6 months.  These can be arranged through your primary care physician. ?
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
## 2021-06-30 NOTE — ED Notes (Signed)
Waiting for medicines being couriered to this facility to arrive. Patient aware of reason for delay. ?

## 2021-06-30 NOTE — ED Notes (Signed)
Called [facility] C[facility] to pick up ordered medications from [facility] and bring to [facility] ER. Lupe Carney with [facility] at [phone] ?
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
## 2021-07-01 LAB — HEPATITIS PANEL, ACUTE
HCV Ab: NONREACTIVE
Hep A IgM: NONREACTIVE
Hep B C IgM: NONREACTIVE
Hepatitis B Surface Ag: NONREACTIVE

## 2021-07-01 NOTE — ED Notes (Signed)
Courier arrived with Hep B immunoglobulin and vaccine ?

## 2021-07-02 LAB — HEPATITIS B SURFACE ANTIBODY, QUANTITATIVE: Hep B S AB Quant (Post): 4 m[IU]/mL — ABNORMAL LOW (ref >9.9)

## 2021-07-02 NOTE — ED Notes (Signed)
This RN called patient per Dr. Read Drivers request. Pt needs to receive Hepatitis B Vaccine in 3 months and 6 months . Pt verbalizes understanding of the need to follow up with his PCP for his 6&9 month vaccines. Pt encouraged tp retirn to [facility] for any concerns. Pt reports he has an appointment with his PCP 07/22/2021 and will set up his vaccine appointments at that time  ?

## 2021-07-29 ENCOUNTER — Other Ambulatory Visit: Payer: Self-pay | Admitting: Internal Medicine

## 2021-07-29 DIAGNOSIS — Z0000 Encounter for general adult medical examination without abnormal findings: Secondary | ICD-10-CM | POA: Diagnosis not present

## 2021-07-29 DIAGNOSIS — E041 Nontoxic single thyroid nodule: Secondary | ICD-10-CM

## 2021-07-29 DIAGNOSIS — J4530 Mild persistent asthma, uncomplicated: Secondary | ICD-10-CM | POA: Diagnosis not present

## 2021-07-29 DIAGNOSIS — N1831 Chronic kidney disease, stage 3a: Secondary | ICD-10-CM | POA: Diagnosis not present

## 2021-07-29 DIAGNOSIS — I10 Essential (primary) hypertension: Secondary | ICD-10-CM | POA: Diagnosis not present

## 2021-07-29 DIAGNOSIS — Z23 Encounter for immunization: Secondary | ICD-10-CM | POA: Diagnosis not present

## 2021-07-29 DIAGNOSIS — K760 Fatty (change of) liver, not elsewhere classified: Secondary | ICD-10-CM | POA: Diagnosis not present

## 2021-07-29 DIAGNOSIS — K429 Umbilical hernia without obstruction or gangrene: Secondary | ICD-10-CM | POA: Diagnosis not present

## 2021-07-29 DIAGNOSIS — R7303 Prediabetes: Secondary | ICD-10-CM | POA: Diagnosis not present

## 2021-07-29 DIAGNOSIS — E782 Mixed hyperlipidemia: Secondary | ICD-10-CM | POA: Diagnosis not present

## 2021-07-29 DIAGNOSIS — G4733 Obstructive sleep apnea (adult) (pediatric): Secondary | ICD-10-CM | POA: Diagnosis not present

## 2021-07-29 DIAGNOSIS — R931 Abnormal findings on diagnostic imaging of heart and coronary circulation: Secondary | ICD-10-CM | POA: Diagnosis not present

## 2021-08-04 ENCOUNTER — Ambulatory Visit
Admission: RE | Admit: 2021-08-04 | Discharge: 2021-08-04 | Disposition: A | Discharge status: Home / Self Care | Payer: 59 | Source: Ambulatory Visit | Attending: Internal Medicine | Admitting: Internal Medicine

## 2021-08-04 DIAGNOSIS — E041 Nontoxic single thyroid nodule: Secondary | ICD-10-CM

## 2021-08-09 ENCOUNTER — Ambulatory Visit: Payer: Self-pay | Admitting: Surgery

## 2021-08-09 DIAGNOSIS — K429 Umbilical hernia without obstruction or gangrene: Secondary | ICD-10-CM | POA: Diagnosis not present

## 2021-08-09 NOTE — H&P (Signed)
Subjective    Chief Complaint: Umbilical Hernia       History of Present Illness: Nathaniel Robertson is a 61 y.o. male who is seen today as an office consultation at the request of Dr. Ashby Dawes for evaluation of Umbilical Hernia .   This is a 61 year old male who presents with a 29-month history of a slowly enlarging bulge at his umbilicus.  He denies any obstructive symptoms.  He was recently examined by his primary care physician who felt that this represented an umbilical hernia.  He is now referred to [place] for repair.   He denies any obstructive symptoms.  The patient has lost a significant amount of weight in a short amount of time and the bulge has become more noticeable.     Review of Systems: A complete review of systems was obtained from the patient.  I have reviewed this information and discussed as appropriate with the patient.  See HPI as well for other ROS.   Review of Systems  Constitutional: Negative.   HENT: Negative.   Eyes: Negative.   Respiratory: Negative.   Cardiovascular: Negative.   Gastrointestinal: Positive for abdominal pain.  Genitourinary: Negative.   Musculoskeletal: Negative.   Skin: Negative.   Neurological: Negative.   Endo/Heme/Allergies: Negative.   Psychiatric/Behavioral: Negative.         Medical History: Past Medical History      Past Medical History:  Diagnosis Date   Asthma, unspecified asthma severity, unspecified whether complicated, unspecified whether persistent     Diabetes mellitus without complication (CMS-HCC)     Hypertension             Patient Active Problem List  Diagnosis   Umbilical hernia without obstruction or gangrene      Past Surgical History  History reviewed. No pertinent surgical history.      Allergies  No Known Allergies           Current Outpatient Medications on File Prior to Visit  Medication Sig Dispense Refill   atorvastatin (LIPITOR) 10 MG tablet         metFORMIN (GLUCOPHAGE-XR) 500 MG  XR tablet TAKE 2 TABLETS BY MOUTH ONCE DAILY WITH  EVENING  MEAL       montelukast (SINGULAIR) 10 mg tablet TAKE 1 TABLET BY MOUTH EVERY EVENING AS NEEDED       amLODIPine (NORVASC) 10 MG tablet         levocetirizine (XYZAL) 5 MG tablet Take 5 mg by mouth every evening       losartan (COZAAR) 50 MG tablet Take 50 mg by mouth once daily        No current facility-administered medications on file prior to visit.      Family History       Family History  Problem Relation Age of Onset   Diabetes Mother          Social History       Tobacco Use  Smoking Status Never  Smokeless Tobacco Never      Social History  Social History        Socioeconomic History   Marital status: Married  Tobacco Use   Smoking status: Never   Smokeless tobacco: Never  Vaping Use   Vaping Use: Never used  Substance and Sexual Activity   Alcohol use: Never   Drug use: Never        Objective:         Vitals:    08/09/21 0948  BP: 122/86  Pulse: 83  Temp: 36.4 C (97.5 F)  SpO2: 99%  Weight: 93.8 kg (206 lb 12.8 oz)  Height: 180.3 cm (5\' 11" )    Body mass index is 28.84 kg/m.   Physical Exam    Constitutional:  WDWN in NAD, conversant, no obvious deformities; lying in bed comfortably Eyes:  Pupils equal, round; sclera anicteric; moist conjunctiva; no lid lag HENT:  Oral mucosa moist; good dentition  Neck:  No masses palpated, trachea midline; no thyromegaly Lungs:  CTA bilaterally; normal respiratory effort Breasts:  symmetric, no nipple changes; no palpable masses or lymphadenopathy on either side CV:  Regular rate and rhythm; no murmurs; extremities well-perfused with no edema Abd:  +bowel sounds, soft, non-tender, no palpable organomegaly; protruding umbilical hernia that is mostly reducible. Musc: Normal gait; no apparent clubbing or cyanosis in extremities Lymphatic:  No palpable cervical or axillary lymphadenopathy Skin:  Warm, dry; no sign of jaundice Psychiatric - alert  and oriented x 4; calm mood and affect         Assessment and Plan:  Diagnoses and all orders for this visit:   Umbilical hernia without obstruction or gangrene       Umbilical hernia repair with mesh.The surgical procedure has been discussed with the patient.  Potential risks, benefits, alternative treatments, and expected outcomes have been explained.  All of the patient's questions at this time have been answered.  The likelihood of reaching the patient's treatment goal is good.  The patient understand the proposed surgical procedure and wishes to proceed.       Tennyson Wacha Jearld Adjutant, MD  08/09/2021 10:09 AM

## 2021-10-06 ENCOUNTER — Other Ambulatory Visit: Payer: Self-pay

## 2021-10-06 ENCOUNTER — Encounter (HOSPITAL_BASED_OUTPATIENT_CLINIC_OR_DEPARTMENT_OTHER): Payer: Self-pay | Admitting: Surgery

## 2021-10-08 ENCOUNTER — Encounter (HOSPITAL_BASED_OUTPATIENT_CLINIC_OR_DEPARTMENT_OTHER)
Admission: RE | Admit: 2021-10-08 | Discharge: 2021-10-08 | Disposition: A | Discharge status: Home / Self Care | Payer: 59 | Source: Ambulatory Visit | Attending: Surgery | Admitting: Surgery

## 2021-10-08 DIAGNOSIS — Z01812 Encounter for preprocedural laboratory examination: Secondary | ICD-10-CM | POA: Diagnosis not present

## 2021-10-08 LAB — BASIC METABOLIC PANEL
Anion gap: 7 (ref 5–15)
BUN: 18 mg/dL (ref 8–23)
CO2: 26 mmol/L (ref 22–32)
Calcium: 9.4 mg/dL (ref 8.9–10.3)
Chloride: 107 mmol/L (ref 98–111)
Creatinine, Ser: 1.4 mg/dL — ABNORMAL HIGH (ref 0.61–1.24)
GFR, Estimated: 57 mL/min — ABNORMAL LOW (ref >60)
Glucose, Bld: 118 mg/dL — ABNORMAL HIGH (ref 70–99)
Potassium: 4.2 mmol/L (ref 3.5–5.1)
Sodium: 140 mmol/L (ref 135–145)

## 2021-10-08 NOTE — Progress Notes (Signed)

## 2021-10-13 ENCOUNTER — Ambulatory Visit (HOSPITAL_BASED_OUTPATIENT_CLINIC_OR_DEPARTMENT_OTHER)
Admission: RE | Admit: 2021-10-13 | Discharge: 2021-10-13 | Disposition: A | Discharge status: Home / Self Care | Payer: 59 | Attending: Surgery | Admitting: Surgery

## 2021-10-13 ENCOUNTER — Other Ambulatory Visit: Payer: Self-pay

## 2021-10-13 ENCOUNTER — Encounter (HOSPITAL_BASED_OUTPATIENT_CLINIC_OR_DEPARTMENT_OTHER)
Admission: RE | Disposition: A | Discharge status: Home / Self Care | Payer: Self-pay | Source: Home / Self Care | Attending: Surgery

## 2021-10-13 ENCOUNTER — Encounter (HOSPITAL_BASED_OUTPATIENT_CLINIC_OR_DEPARTMENT_OTHER): Payer: Self-pay | Admitting: Surgery

## 2021-10-13 ENCOUNTER — Ambulatory Visit (HOSPITAL_BASED_OUTPATIENT_CLINIC_OR_DEPARTMENT_OTHER): Payer: 59 | Admitting: Anesthesiology

## 2021-10-13 DIAGNOSIS — N289 Disorder of kidney and ureter, unspecified: Secondary | ICD-10-CM

## 2021-10-13 DIAGNOSIS — G4730 Sleep apnea, unspecified: Secondary | ICD-10-CM | POA: Insufficient documentation

## 2021-10-13 DIAGNOSIS — E119 Type 2 diabetes mellitus without complications: Secondary | ICD-10-CM | POA: Diagnosis not present

## 2021-10-13 DIAGNOSIS — J45909 Unspecified asthma, uncomplicated: Secondary | ICD-10-CM | POA: Insufficient documentation

## 2021-10-13 DIAGNOSIS — I10 Essential (primary) hypertension: Secondary | ICD-10-CM | POA: Insufficient documentation

## 2021-10-13 DIAGNOSIS — K429 Umbilical hernia without obstruction or gangrene: Secondary | ICD-10-CM | POA: Diagnosis not present

## 2021-10-13 HISTORY — PX: INSERTION OF MESH: SHX5868

## 2021-10-13 HISTORY — PX: UMBILICAL HERNIA REPAIR: SHX196

## 2021-10-13 HISTORY — DX: Type 2 diabetes mellitus without complications: E11.9

## 2021-10-13 HISTORY — DX: Chronic kidney disease, unspecified: N18.9

## 2021-10-13 LAB — GLUCOSE, CAPILLARY
Glucose-Capillary: 103 mg/dL — ABNORMAL HIGH (ref 70–99)
Glucose-Capillary: 160 mg/dL — ABNORMAL HIGH (ref 70–99)

## 2021-10-13 SURGERY — REPAIR, HERNIA, UMBILICAL, ADULT
Anesthesia: General | Site: Abdomen

## 2021-10-13 MED ORDER — FENTANYL CITRATE (PF) 100 MCG/2ML IJ SOLN
INTRAMUSCULAR | Status: AC
  Filled 2021-10-13: qty 2

## 2021-10-13 MED ORDER — CEFAZOLIN SODIUM-DEXTROSE 2-4 GM/100ML-% IV SOLN
2.0000 g | INTRAVENOUS | Status: AC
  Administered 2021-10-13: 2 g via INTRAVENOUS

## 2021-10-13 MED ORDER — ONDANSETRON HCL 4 MG/2ML IJ SOLN
INTRAMUSCULAR | Status: DC | PRN
  Administered 2021-10-13: 4 mg via INTRAVENOUS

## 2021-10-13 MED ORDER — ACETAMINOPHEN 500 MG PO TABS
ORAL_TABLET | ORAL | Status: AC
  Filled 2021-10-13: qty 2

## 2021-10-13 MED ORDER — LIDOCAINE HCL (CARDIAC) PF 100 MG/5ML IV SOSY
PREFILLED_SYRINGE | INTRAVENOUS | Status: DC | PRN
  Administered 2021-10-13: 60 mg via INTRAVENOUS

## 2021-10-13 MED ORDER — CHLORHEXIDINE GLUCONATE CLOTH 2 % EX PADS: 6.0000 | MEDICATED_PAD | Freq: Once | CUTANEOUS | Status: DC

## 2021-10-13 MED ORDER — CEFAZOLIN SODIUM-DEXTROSE 2-4 GM/100ML-% IV SOLN
INTRAVENOUS | Status: AC
  Filled 2021-10-13: qty 100

## 2021-10-13 MED ORDER — DEXAMETHASONE SODIUM PHOSPHATE 4 MG/ML IJ SOLN
INTRAMUSCULAR | Status: DC | PRN
  Administered 2021-10-13: 4 mg via INTRAVENOUS

## 2021-10-13 MED ORDER — FENTANYL CITRATE (PF) 100 MCG/2ML IJ SOLN
INTRAMUSCULAR | Status: DC | PRN
  Administered 2021-10-13: 25 ug via INTRAVENOUS
  Administered 2021-10-13: 50 ug via INTRAVENOUS
  Administered 2021-10-13: 25 ug via INTRAVENOUS

## 2021-10-13 MED ORDER — OXYCODONE HCL 5 MG PO TABS
5.0000 mg | ORAL_TABLET | Freq: Once | ORAL | Status: AC
  Administered 2021-10-13: 5 mg via ORAL

## 2021-10-13 MED ORDER — FENTANYL CITRATE (PF) 100 MCG/2ML IJ SOLN
25.0000 ug | INTRAMUSCULAR | Status: DC | PRN
  Administered 2021-10-13 (×2): 50 ug via INTRAVENOUS

## 2021-10-13 MED ORDER — ACETAMINOPHEN 500 MG PO TABS
1000.0000 mg | ORAL_TABLET | ORAL | Status: AC
  Administered 2021-10-13: 1000 mg via ORAL

## 2021-10-13 MED ORDER — BUPIVACAINE-EPINEPHRINE 0.25% -1:200000 IJ SOLN
INTRAMUSCULAR | Status: DC | PRN
  Administered 2021-10-13: 10 mL

## 2021-10-13 MED ORDER — PROPOFOL 10 MG/ML IV BOLUS
INTRAVENOUS | Status: DC | PRN
  Administered 2021-10-13: 150 mg via INTRAVENOUS

## 2021-10-13 MED ORDER — PROPOFOL 500 MG/50ML IV EMUL
INTRAVENOUS | Status: DC | PRN
  Administered 2021-10-13: 25 ug/kg/min via INTRAVENOUS

## 2021-10-13 MED ORDER — BUPIVACAINE-EPINEPHRINE (PF) 0.25% -1:200000 IJ SOLN
INTRAMUSCULAR | Status: AC
  Filled 2021-10-13: qty 210

## 2021-10-13 MED ORDER — OXYCODONE HCL 5 MG PO TABS: 5.0000 mg | ORAL_TABLET | Freq: Four times a day (QID) | ORAL | 0 refills | Status: DC | PRN

## 2021-10-13 MED ORDER — LACTATED RINGERS IV SOLN: INTRAVENOUS | Status: DC

## 2021-10-13 MED ORDER — OXYCODONE HCL 5 MG PO TABS
ORAL_TABLET | ORAL | Status: AC
  Filled 2021-10-13: qty 1

## 2021-10-13 SURGICAL SUPPLY — 48 items
APL PRP STRL LF DISP 70% ISPRP (MISCELLANEOUS) ×2
APL SKNCLS STERI-STRIP NONHPOA (GAUZE/BANDAGES/DRESSINGS) ×2
BENZOIN TINCTURE PRP APPL 2/3 (GAUZE/BANDAGES/DRESSINGS) ×3 IMPLANT
BLADE CLIPPER SURG (BLADE) ×3 IMPLANT
BLADE HEX COATED 2.75 (ELECTRODE) ×3 IMPLANT
BLADE SURG 15 STRL LF DISP TIS (BLADE) ×2 IMPLANT
BLADE SURG 15 STRL SS (BLADE) ×3
CANISTER SUCT 1200ML W/VALVE (MISCELLANEOUS) IMPLANT
CHLORAPREP W/TINT 26 (MISCELLANEOUS) ×3 IMPLANT
COVER BACK TABLE 60X90IN (DRAPES) ×3 IMPLANT
COVER MAYO STAND STRL (DRAPES) ×3 IMPLANT
DRAPE LAPAROTOMY 100X72 PEDS (DRAPES) ×3 IMPLANT
DRAPE UTILITY XL STRL (DRAPES) ×3 IMPLANT
DRSG TEGADERM 4X4.75 (GAUZE/BANDAGES/DRESSINGS) ×3 IMPLANT
ELECT REM PT RETURN 9FT ADLT (ELECTROSURGICAL) ×3
ELECTRODE REM PT RTRN 9FT ADLT (ELECTROSURGICAL) ×2 IMPLANT
GAUZE SPONGE 4X4 12PLY STRL LF (GAUZE/BANDAGES/DRESSINGS) IMPLANT
GLOVE BIO SURGEON STRL SZ 6.5 (GLOVE) ×2 IMPLANT
GLOVE BIO SURGEON STRL SZ7 (GLOVE) ×3 IMPLANT
GLOVE BIOGEL PI IND STRL 7.0 (GLOVE) IMPLANT
GLOVE BIOGEL PI IND STRL 7.5 (GLOVE) ×2 IMPLANT
GLOVE BIOGEL PI INDICATOR 7.0 (GLOVE) ×2
GLOVE BIOGEL PI INDICATOR 7.5 (GLOVE) ×1
GOWN STRL REUS W/ TWL LRG LVL3 (GOWN DISPOSABLE) ×4 IMPLANT
GOWN STRL REUS W/TWL LRG LVL3 (GOWN DISPOSABLE) ×9
MESH VENTRALEX ST 1-7/10 CRC S (Mesh General) ×1 IMPLANT
NDL HYPO 25X1 1.5 SAFETY (NEEDLE) ×2 IMPLANT
NEEDLE HYPO 25X1 1.5 SAFETY (NEEDLE) ×3 IMPLANT
NS IRRIG 1000ML POUR BTL (IV SOLUTION) IMPLANT
PACK BASIN DAY SURGERY FS (CUSTOM PROCEDURE TRAY) ×3 IMPLANT
PENCIL SMOKE EVACUATOR (MISCELLANEOUS) ×3 IMPLANT
SLEEVE SCD COMPRESS KNEE MED (STOCKING) ×3 IMPLANT
SPONGE GAUZE 2X2 8PLY STRL LF (GAUZE/BANDAGES/DRESSINGS) ×3 IMPLANT
STRIP CLOSURE SKIN 1/2X4 (GAUZE/BANDAGES/DRESSINGS) ×3 IMPLANT
SUT MNCRL AB 4-0 PS2 18 (SUTURE) ×3 IMPLANT
SUT NOVA 0 T19/GS 22DT (SUTURE) ×1 IMPLANT
SUT NOVA NAB DX-16 0-1 5-0 T12 (SUTURE) IMPLANT
SUT NOVA NAB GS-21 1 T12 (SUTURE) ×1 IMPLANT
SUT PDS AB 0 CT 36 (SUTURE) IMPLANT
SUT PROLENE 0 CT 2 (SUTURE) IMPLANT
SUT VIC AB 2-0 CT1 27 (SUTURE)
SUT VIC AB 2-0 CT1 TAPERPNT 27 (SUTURE) IMPLANT
SUT VIC AB 3-0 SH 27 (SUTURE) ×3
SUT VIC AB 3-0 SH 27X BRD (SUTURE) ×2 IMPLANT
SUT VICRYL 4-0 PS2 18IN ABS (SUTURE) IMPLANT
SYR BULB EAR ULCER 3OZ GRN STR (SYRINGE) IMPLANT
SYR CONTROL 10ML LL (SYRINGE) ×3 IMPLANT
TOWEL GREEN STERILE FF (TOWEL DISPOSABLE) ×3 IMPLANT

## 2021-10-13 NOTE — Transfer of Care (Signed)
Immediate Anesthesia Transfer of Care Note  Patient: Nathaniel Robertson  Procedure(s) Performed: OPEN UMBILICAL HERNIA REPAIR WITH MESH (Abdomen) INSERTION OF MESH (Abdomen)  Patient Location: PACU  Anesthesia Type:General  Level of Consciousness: drowsy and patient cooperative  Airway & Oxygen Therapy: Patient Spontanous Breathing and Patient connected to face mask oxygen  Post-op Assessment: Report given to RN and Post -op Vital signs reviewed and stable  Post vital signs: Reviewed and stable  Last Vitals:  Vitals Value Taken Time  BP 96/57 10/13/21 0816  Temp    Pulse 66 10/13/21 0817  Resp 12 10/13/21 0817  SpO2 98 % 10/13/21 0817  Vitals shown include unvalidated device data.  Last Pain:  Vitals:   10/13/21 0641  TempSrc: Oral  PainSc: 0-No pain         Complications: No notable events documented.

## 2021-10-13 NOTE — Anesthesia Procedure Notes (Signed)
Procedure Name: LMA Insertion Date/Time: 10/13/2021 7:26 AM  Performed by: Oronde Hallenbeck, Jewel Baize, CRNAPre-anesthesia Checklist: Patient identified, Emergency Drugs available, Suction available and Patient being monitored Patient Re-evaluated:Patient Re-evaluated prior to induction Oxygen Delivery Method: Circle system utilized Preoxygenation: Pre-oxygenation with 100% oxygen Induction Type: IV induction Ventilation: Mask ventilation without difficulty LMA: LMA inserted LMA Size: 4.0 Number of attempts: 1 Airway Equipment and Method: Bite block Placement Confirmation: positive ETCO2 Tube secured with: Tape Dental Injury: Teeth and Oropharynx as per pre-operative assessment

## 2021-10-13 NOTE — Anesthesia Postprocedure Evaluation (Signed)
Anesthesia Post Note  Patient: Nathaniel Robertson  Procedure(s) Performed: OPEN UMBILICAL HERNIA REPAIR WITH MESH (Abdomen) INSERTION OF MESH (Abdomen)     Patient location during evaluation: PACU Anesthesia Type: General Level of consciousness: awake Pain management: pain level controlled Vital Signs Assessment: post-procedure vital signs reviewed and stable Respiratory status: spontaneous breathing Cardiovascular status: stable Postop Assessment: no apparent nausea or vomiting Anesthetic complications: no   No notable events documented.  Last Vitals:  Vitals:   10/13/21 0816 10/13/21 0830  BP: (!) 96/57 112/69  Pulse:  81  Resp:  16  Temp: 36.4 C   SpO2:  99%    Last Pain:  Vitals:   10/13/21 0830  TempSrc:   PainSc: 0-No pain                 Venesa Semidey

## 2021-10-13 NOTE — Discharge Instructions (Addendum)
No tylenol until 1:00 p.m. [facility] _______[facility]  UMBILICAL OR INGUINAL HERNIA REPAIR: POST OP INSTRUCTIONS  Always review your discharge instruction sheet given to you by the facility where your surgery was performed. IF YOU HAVE DISABILITY OR FAMILY LEAVE FORMS, YOU MUST BRING THEM TO THE OFFICE FOR PROCESSING.   DO NOT GIVE THEM TO YOUR DOCTOR.  1. A  prescription for pain medication may be given to you upon discharge.  Take your pain medication as prescribed, if needed.  If narcotic pain medicine is not needed, then you may take acetaminophen (Tylenol) or ibuprofen (Advil) as needed. 2. Take your usually prescribed medications unless otherwise directed. If you need a refill on your pain medication, please contact your pharmacy.  They will contact our office to request authorization. Prescriptions will not be filled after 5 pm or on week-ends. 3. You should follow a light diet the first 24 hours after arrival home, such as soup and crackers, etc.  Be sure to include lots of fluids daily.  Resume your normal diet the day after surgery. 4.Most patients will experience some swelling and bruising around the umbilicus or in the groin and scrotum.  Ice packs and reclining will help.  Swelling and bruising can take several days to resolve.  6. It is common to experience some constipation if taking pain medication after surgery.  Increasing fluid intake and taking a stool softener (such as Colace) will usually help or prevent this problem from occurring.  A mild laxative (Milk of Magnesia or Miralax) should be taken according to package directions if there are no bowel movements after 48 hours. 7. Unless discharge instructions indicate otherwise, you may remove your bandages 24-48 hours after surgery, and you may shower at that time.  You may have steri-strips (small skin tapes) in place directly over the incision.  These strips should be left on the skin for 7-10 days.  If your surgeon  used skin glue on the incision, you may shower in 24 hours.  The glue will flake off over the next 2-3 weeks.  Any sutures or staples will be removed at the office during your follow-up visit. 8. ACTIVITIES:  You may resume regular (light) daily activities beginning the next day--such as daily self-care, walking, climbing stairs--gradually increasing activities as tolerated.  You may have sexual intercourse when it is comfortable.  Refrain from any heavy lifting or straining until approved by your doctor.  a.You may drive when you are no longer taking prescription pain medication, you can comfortably wear a seatbelt, and you can safely maneuver your car and apply brakes. b.RETURN TO WORK:   _____________________________________________  9.You should see your doctor in the office for a follow-up appointment approximately 2-3 weeks after your surgery.  Make sure that you call for this appointment within a day or two after you arrive home to insure a convenient appointment time. 10.OTHER INSTRUCTIONS: _________________________    _____________________________________  WHEN TO CALL YOUR DOCTOR: Fever over 101.0 Inability to urinate Nausea and/or vomiting Extreme swelling or bruising Continued bleeding from incision. Increased pain, redness, or drainage from the incision  The clinic staff is available to answer your questions during regular business hours.  Please don't hesitate to call and ask to speak to one of the nurses for clinical concerns.  If you have a medical emergency, go to the nearest emergency room or call 911.  A surgeon from [facility] is always on call at the hospital   [address], [place], [place]  [address] ?  [address] [address], [place], [place]   [address] [phone] ? [phone] ? FAX [phone] Web site: [email or web address]  Post Anesthesia Home Care Instructions  Activity: Get plenty of rest for the remainder of the day. A  responsible individual must stay with you for 24 hours following the procedure.  For the next 24 hours, DO NOT: -Drive a car -Advertising copywriter -Drink alcoholic beverages -Take any medication unless instructed by your physician -Make any legal decisions or sign important papers.  Meals: Start with liquid foods such as gelatin or soup. Progress to regular foods as tolerated. Avoid greasy, spicy, heavy foods. If nausea and/or vomiting occur, drink only clear liquids until the nausea and/or vomiting subsides. Call your physician if vomiting continues.  Special Instructions/Symptoms: Your throat may feel dry or sore from the anesthesia or the breathing tube placed in your throat during surgery. If this causes discomfort, gargle with warm salt water. The discomfort should disappear within 24 hours.  If you had a scopolamine patch placed behind your ear for the management of post- operative nausea and/or vomiting:  1. The medication in the patch is effective for 72 hours, after which it should be removed.  Wrap patch in a tissue and discard in the trash. Wash hands thoroughly with soap and water. 2. You may remove the patch earlier than 72 hours if you experience unpleasant side effects which may include dry mouth, dizziness or visual disturbances. 3. Avoid touching the patch. Wash your hands with soap and water after contact with the patch.

## 2021-10-13 NOTE — Op Note (Signed)
Indications:  The patient presented with a history of reducible umbilical hernia.  The patient was examined and we recommended umbilical hernia repair with mesh.  Pre-operative diagnosis:  Umbilical hernia  Post-operative diagnosis:  Same  Procedure:  Umbilical hernia repair with mesh  Surgeon:  Wynona Luna Assistant:  Saunders Glance, PA-C  Procedure Details  The patient was seen again in the Holding Room. The risks, benefits, complications, treatment options, and expected outcomes were discussed with the patient. The possibilities of reaction to medication, pulmonary aspiration, perforation of viscus, bleeding, recurrent infection, the need for additional procedures, and development of a complication requiring transfusion or further operation were discussed with the patient and/or family. There was concurrence with the proposed plan, and informed consent was obtained. The site of surgery was properly noted/marked. The patient was taken to the Operating Room, identified as [facility], and the procedure verified as umbilical hernia repair. A Time Out was held and the above information confirmed.  After an adequate level of general anesthesia was obtained, the patient's abdomen was prepped with Chloraprep and draped in sterile fashion.  We made a transverse incision below the umbilicus.  Dissection was carried down to the hernia sac with cautery.  We dissected bluntly around the hernia sac down to the edge of the fascial defect.  We reduced the hernia sac back into the pre-peritoneal space.  The fascial defect measured 1.5 cm.  We cleared the fascia in all directions.  A small Ventralex mesh was inserted into the pre-peritoneal space and was deployed.  The mesh was secured with four trans-fascial sutures of 0 Novofil.  The fascial defect was closed with multiple interrupted figure-of-eight 1 Novofil sutures.  The base of the umbilicus was tacked down with 3-0 Vicryl.  3-0 Vicryl was used to close  the subcutaneous tissues and 4-0 Monocryl was used to close the skin.  Steri-strips and clean dressing were applied.  The patient was extubated and brought to the recovery room in stable condition.  All sponge, instrument, and needle counts were correct prior to closure and at the conclusion of the case.   Estimated Blood Loss: Minimal          Complications: None; patient tolerated the procedure well.         Disposition: PACU - hemodynamically stable.         Condition: stable  Wilmon Arms. Corliss Skains, MD, [facility]  General Surgery   10/13/2021 8:08 AM

## 2021-10-13 NOTE — H&P (Signed)
Subjective    Chief Complaint: Umbilical Hernia       History of Present Illness: Nathaniel Robertson is a 61 y.o. male who is seen today as an office consultation at the request of Dr. Nicholos Johns for evaluation of Umbilical Hernia .   This is a 60 year old male who presents with a 63-month history of a slowly enlarging bulge at his umbilicus.  He denies any obstructive symptoms.  He was recently examined by his primary care physician who felt that this represented an umbilical hernia.  He is now referred to [place] for repair.   He denies any obstructive symptoms.  The patient has lost a significant amount of weight in a short amount of time and the bulge has become more noticeable.     Review of Systems: A complete review of systems was obtained from the patient.  I have reviewed this information and discussed as appropriate with the patient.  See HPI as well for other ROS.   Review of Systems  Constitutional: Negative.   HENT: Negative.   Eyes: Negative.   Respiratory: Negative.   Cardiovascular: Negative.   Gastrointestinal: Positive for abdominal pain.  Genitourinary: Negative.   Musculoskeletal: Negative.   Skin: Negative.   Neurological: Negative.   Endo/Heme/Allergies: Negative.   Psychiatric/Behavioral: Negative.         Medical History: Past Medical History         Past Medical History:  Diagnosis Date   Asthma, unspecified asthma severity, unspecified whether complicated, unspecified whether persistent     Diabetes mellitus without complication (CMS-HCC)     Hypertension               Patient Active Problem List  Diagnosis   Umbilical hernia without obstruction or gangrene      Past Surgical History  History reviewed. No pertinent surgical history.      Allergies  No Known Allergies                 Current Outpatient Medications on File Prior to Visit  Medication Sig Dispense Refill   atorvastatin (LIPITOR) 10 MG tablet         metFORMIN (GLUCOPHAGE-XR)  500 MG XR tablet TAKE 2 TABLETS BY MOUTH ONCE DAILY WITH  EVENING  MEAL       montelukast (SINGULAIR) 10 mg tablet TAKE 1 TABLET BY MOUTH EVERY EVENING AS NEEDED       amLODIPine (NORVASC) 10 MG tablet         levocetirizine (XYZAL) 5 MG tablet Take 5 mg by mouth every evening       losartan (COZAAR) 50 MG tablet Take 50 mg by mouth once daily        No current facility-administered medications on file prior to visit.      Family History           Family History  Problem Relation Age of Onset   Diabetes Mother          Social History         Tobacco Use  Smoking Status Never  Smokeless Tobacco Never      Social History  Social History           Socioeconomic History   Marital status: Married  Tobacco Use   Smoking status: Never   Smokeless tobacco: Never  Vaping Use   Vaping Use: Never used  Substance and Sexual Activity   Alcohol use: Never   Drug use: Never  Objective:           Vitals:   Body mass index is 28.84 kg/m.   Physical Exam    Constitutional:  WDWN in NAD, conversant, no obvious deformities; lying in bed comfortably Eyes:  Pupils equal, round; sclera anicteric; moist conjunctiva; no lid lag HENT:  Oral mucosa moist; good dentition  Neck:  No masses palpated, trachea midline; no thyromegaly Lungs:  CTA bilaterally; normal respiratory effort Breasts:  symmetric, no nipple changes; no palpable masses or lymphadenopathy on either side CV:  Regular rate and rhythm; no murmurs; extremities well-perfused with no edema Abd:  +bowel sounds, soft, non-tender, no palpable organomegaly; protruding umbilical hernia that is mostly reducible. Musc: Normal gait; no apparent clubbing or cyanosis in extremities Lymphatic:  No palpable cervical or axillary lymphadenopathy Skin:  Warm, dry; no sign of jaundice Psychiatric - alert and oriented x 4; calm mood and affect         Assessment and Plan:  Diagnoses and all orders for this visit:    Umbilical hernia without obstruction or gangrene       Umbilical hernia repair with mesh.The surgical procedure has been discussed with the patient.  Potential risks, benefits, alternative treatments, and expected outcomes have been explained.  All of the patient's questions at this time have been answered.  The likelihood of reaching the patient's treatment goal is good.  The patient understand the proposed surgical procedure and wishes to proceed.     Wilmon Arms. Corliss Skains, MD, [facility]  General Surgery   10/13/2021 7:11 AM

## 2021-10-13 NOTE — Anesthesia Preprocedure Evaluation (Signed)
Anesthesia Evaluation  Patient identified by MRN, date of birth, ID band Patient awake    Reviewed: Allergy & Precautions, NPO status , Patient's Chart, lab work & pertinent test results  Airway Mallampati: II  TM Distance: >3 FB     Dental   Pulmonary asthma , sleep apnea ,    breath sounds clear to auscultation       Cardiovascular hypertension,  Rhythm:Regular Rate:Normal     Neuro/Psych    GI/Hepatic Neg liver ROS, Hx noted Dr. Chilton Si   Endo/Other  diabetes  Renal/GU Renal disease     Musculoskeletal   Abdominal   Peds  Hematology   Anesthesia Other Findings   Reproductive/Obstetrics                             Anesthesia Physical Anesthesia Plan  ASA: 3  Anesthesia Plan: General   Post-op Pain Management:    Induction:   PONV Risk Score and Plan: 3 and Ondansetron, Dexamethasone and Midazolam  Airway Management Planned: Oral ETT  Additional Equipment:   Intra-op Plan:   Post-operative Plan: Extubation in OR  Informed Consent: I have reviewed the patients History and Physical, chart, labs and discussed the procedure including the risks, benefits and alternatives for the proposed anesthesia with the patient or authorized representative who has indicated his/her understanding and acceptance.     Dental advisory given  Plan Discussed with: CRNA and Anesthesiologist  Anesthesia Plan Comments:         Anesthesia Quick Evaluation

## 2021-10-14 ENCOUNTER — Encounter (HOSPITAL_BASED_OUTPATIENT_CLINIC_OR_DEPARTMENT_OTHER): Payer: Self-pay | Admitting: Surgery

## 2021-11-22 NOTE — Progress Notes (Signed)
Follow Up Note  RE: Nathaniel Robertson MRN: [id number] DOB: 1960-04-03 Date of Office Visit: 11/23/2021  Referring provider: Georgianne Fick, MD Primary care provider: Georgianne Fick, MD  Chief Complaint: Asthma, Allergic Rhinitis , and Pruritus  History of Present Illness: I had the pleasure of seeing Nathaniel Robertson for a follow up visit at the [facility] on 11/23/2021. He is a 61 y.o. male, who is being followed for asthma, pruritus, allergic rhinoconjunctivitis. His previous allergy office visit was on 05/20/2021 with Dr. [name]. Today is a regular follow up visit.  Asthma Currently on Airduo 1 puff twice a day but ran out 1 month ago. He didn't notice any worsening symptoms. Still taking Singulair at night.  Denies any SOB, coughing, wheezing, chest tightness, nocturnal awakenings, ER/urgent care visits or prednisone use since the last visit. No recent albuterol use.  Patient lost 50 lbs - intentionally which has helped with his diabetes and hypertension.   He had hernia surgery in July.  Pruritus Stopped Dupixent injections in March due to cost. Using triam/Eucerin moisturizer once a day. Taking hydroxyzine as needed. No flares in symptoms.   Seasonal and perennial allergic rhinoconjunctivitis Taking Singulair and Xyzal with good benefit. No nasal spray use.  Noted some blurry vision in the mornings and then his vision becomes normal again. He states that he saw his eye doctor and they didn't find anything.   Assessment and Plan: Nathaniel Robertson is a 61 y.o. male with: Pruritus Past history - Whole body pruritus for many years and now having some hyperpigmented excoriating changes on the skin.  Patient was seen by allergy in 2017 and dermatology in the past with no identifiable triggers.  2022 skin testing showed: Positive to weed pollen, tree pollen, dust mites, cat, cockroach, dog and ragweed pollen, grass. Borderline positive to soy and almonds.   Interim history - stopped Dupixent in March due to cost. No flare in symptoms. Continue proper skin care - use fragrance free and dye free products. Moisturize once day with triamcinolone:Eucerin mix Take hydroxyzine 25mg  1 hour before bedtime ONLY as needed for the itching.  If you get itchy again let me know.   Asthma Past history - Reports shortness of breath at times but much better with Singulair and Xyzal.  He also uses Symbicort 160 mcg as needed and has been using it more frequently the last few days. 2022 spirometry shows some restriction with 30% improvement in FEV1 post bronchodilator treatment.  Clinically feeling improved. Interim history - ran out of Airduo x 1 month with no worsening symptoms.  Daily controller medication(s): Continue Singulair (montelukast) 10mg  daily at night. Start Advair Diskus 2023 1 puff twice a day and rinse mouth after each use. Let me know if not covered.  May use albuterol rescue inhaler 2 puffs every 4 to 6 hours as needed for shortness of breath, chest tightness, coughing, and wheezing. May use albuterol rescue inhaler 2 puffs 5 to 15 minutes prior to strenuous physical activities. Monitor frequency of use.  Get spirometry at next visit - no spirometry today due to recent hernia surgery.   Seasonal and perennial allergic rhinoconjunctivitis Past history - Rhinoconjunctivitis symptoms mainly in the spring.  2017 skin testing was positive to trees, cockroach, grass, cat and dust mites.  [place] for 2 years with some benefit. 2022 skin testing showed: Positive to weed pollen, tree pollen, dust mites, cat, cockroach, dog and ragweed pollen, grass.  Interim history - controlled. Continue environmental control measures. Continue Singulair (montelukast) 10mg  daily at night. May use over the counter antihistamines such as Zyrtec (cetirizine), Claritin (loratadine), Allegra (fexofenadine), or Xyzal (levocetirizine) daily as needed as  above.  May use Ryaltris (olopatadine + mometasone nasal spray combination) 1-2 sprays per nostril twice a day as needed.  Nasal saline spray (i.e., Simply Saline) or nasal saline lavage (i.e., NeilMed) is recommended as needed and prior to medicated nasal sprays. Consider allergy injections for long term control if above medications do not help the symptoms.  Blurry vision In the mornings.  If no improvement, recommend follow up with PCP and eye doctor.  Return in about 6 months (around 05/24/2022).  Meds ordered this encounter  Medications   fluticasone-salmeterol (ADVAIR) 250-50 MCG/ACT AEPB    Sig: Inhale 1 puff into the lungs in the morning and at bedtime. Rinse mouth after each use.    Dispense:  60 each    Refill:  5    May dispense generic or branded - whichever is covered.   Lab Orders  No laboratory test(s) ordered today    Diagnostics: None.   Medication List:  Current Outpatient Medications  Medication Sig Dispense Refill   albuterol (PROVENTIL HFA;VENTOLIN HFA) 108 (90 BASE) MCG/ACT inhaler Inhale 2 puffs into the lungs every 6 (six) hours as needed for wheezing or shortness of breath.     amLODipine (NORVASC) 10 MG tablet TK 1 T PO QD  2   atorvastatin (LIPITOR) 10 MG tablet TK 1 T PO  D. GEF LIPITOR  1   Cholecalciferol (VITAMIN D-1000 MAX ST PO) Take by mouth.     diclofenac (VOLTAREN) 50 MG EC tablet Take 50 mg by mouth 2 (two) times daily.     EPINEPHrine 0.3 mg/0.3 mL IJ SOAJ injection Use as directed for life-threatening allergic reaction. 1 each 2   fluticasone-salmeterol (ADVAIR) 250-50 MCG/ACT AEPB Inhale 1 puff into the lungs in the morning and at bedtime. Rinse mouth after each use. 60 each 5   hydrOXYzine (ATARAX) 25 MG tablet Take 1 tablet (25 mg total) by mouth at bedtime as needed for itching. 30 tablet 2   levocetirizine (XYZAL) 5 MG tablet Take 5 mg by mouth every evening.     losartan (COZAAR) 100 MG tablet Take 100 mg by mouth daily.  0    metFORMIN (GLUCOPHAGE-XR) 500 MG 24 hr tablet Take 500 mg by mouth at bedtime.   0   montelukast (SINGULAIR) 10 MG tablet Take 1 tablet (10 mg total) by mouth at bedtime. 90 tablet 3   Omega 3-6-9 Fatty Acids (OMEGA 3-6-9 COMPLEX PO) Take by mouth.     oxyCODONE (OXY IR/ROXICODONE) 5 MG immediate release tablet Take 1 tablet (5 mg total) by mouth every 6 (six) hours as needed for severe pain. 15 tablet 0   triamcinolone ointment (KENALOG) 0.1 % Apply 1 application topically 2 (two) times daily. Use as moisturizer 453 g 1   No current facility-administered medications for this visit.   Allergies: No Known Allergies I reviewed his past medical history, social history, family history, and environmental history and no significant changes have been reported from his previous visit.  Review of Systems  Constitutional:  Negative for appetite change, chills, fever and unexpected weight change.  HENT:  Negative for postnasal drip and rhinorrhea.   Eyes:  Negative for itching.  Respiratory:  Negative for cough, chest tightness, shortness of breath and wheezing.   Cardiovascular:  Negative  for chest pain.  Gastrointestinal:  Negative for abdominal pain.  Genitourinary:  Negative for difficulty urinating.  Skin:  Negative for rash.  Allergic/Immunologic: Positive for environmental allergies.  Neurological:  Negative for headaches.    Objective: BP 120/68   Pulse (!) 58   Resp 16   SpO2 98%  There is no height or weight on file to calculate BMI. Physical Exam Vitals and nursing note reviewed.  Constitutional:      Appearance: Normal appearance. He is well-developed.  HENT:     Head: Normocephalic and atraumatic.     Right Ear: Tympanic membrane and external ear normal.     Left Ear: Tympanic membrane and external ear normal.     Nose: Nose normal.     Mouth/Throat:     Mouth: Mucous membranes are moist.     Pharynx: Oropharynx is clear.  Eyes:     Conjunctiva/sclera: Conjunctivae  normal.  Cardiovascular:     Rate and Rhythm: Normal rate and regular rhythm.     Heart sounds: Normal heart sounds. No murmur heard.    No friction rub. No gallop.  Pulmonary:     Effort: Pulmonary effort is normal.     Breath sounds: Normal breath sounds. No wheezing, rhonchi or rales.  Musculoskeletal:     Cervical back: Neck supple.  Skin:    General: Skin is warm.     Findings: No rash.     Comments: Circular scarring on upper and lower extremities b/l.  Neurological:     Mental Status: He is alert and oriented to person, place, and time.  Psychiatric:        Behavior: Behavior normal.    Previous notes and tests were reviewed. The plan was reviewed with the patient/family, and all questions/concerned were addressed.  It was my pleasure to see Darshan today and participate in his care. Please feel free to contact me with any questions or concerns.  Sincerely,  Wyline Mood, DO Allergy & Immunology  [facility] office: [phone] [facility] office: [phone]

## 2021-11-23 ENCOUNTER — Encounter: Payer: Self-pay | Admitting: Allergy

## 2021-11-23 ENCOUNTER — Ambulatory Visit: Payer: 59 | Admitting: Allergy

## 2021-11-23 VITALS — BP 120/68 | HR 58 | Resp 16

## 2021-11-23 DIAGNOSIS — J4540 Moderate persistent asthma, uncomplicated: Secondary | ICD-10-CM | POA: Diagnosis not present

## 2021-11-23 DIAGNOSIS — H1013 Acute atopic conjunctivitis, bilateral: Secondary | ICD-10-CM

## 2021-11-23 DIAGNOSIS — J302 Other seasonal allergic rhinitis: Secondary | ICD-10-CM | POA: Diagnosis not present

## 2021-11-23 DIAGNOSIS — R21 Rash and other nonspecific skin eruption: Secondary | ICD-10-CM

## 2021-11-23 DIAGNOSIS — L299 Pruritus, unspecified: Secondary | ICD-10-CM

## 2021-11-23 DIAGNOSIS — J3089 Other allergic rhinitis: Secondary | ICD-10-CM

## 2021-11-23 DIAGNOSIS — H1010 Acute atopic conjunctivitis, unspecified eye: Secondary | ICD-10-CM

## 2021-11-23 DIAGNOSIS — H538 Other visual disturbances: Secondary | ICD-10-CM

## 2021-11-23 MED ORDER — FLUTICASONE-SALMETEROL 250-50 MCG/ACT IN AEPB
1.0000 | INHALATION_SPRAY | Freq: Two times a day (BID) | RESPIRATORY_TRACT | 5 refills | Status: DC
Start: 2021-11-23 — End: 2023-05-04

## 2021-11-23 NOTE — Assessment & Plan Note (Signed)
Past history - Whole body pruritus for many years and now having some hyperpigmented excoriating changes on the skin.  Patient was seen by allergy in 2017 and dermatology in the past with no identifiable triggers.  2022 skin testing showed: Positive to weed pollen, tree pollen, dust mites, cat, cockroach, dog and ragweed pollen, grass. Borderline positive to soy and almonds.  Interim history - stopped Dupixent in March due to cost. No flare in symptoms.  Continue proper skin care - use fragrance free and dye free products.  Moisturize once day with triamcinolone:Eucerin mix  Take hydroxyzine 25mg  1 hour before bedtime ONLY as needed for the itching.   If you get itchy again let me know.

## 2021-11-23 NOTE — Assessment & Plan Note (Signed)
In the mornings.  . If no improvement, recommend follow up with PCP and eye doctor.

## 2021-11-23 NOTE — Patient Instructions (Addendum)
Breathing: Daily controller medication(s): Continue Singulair (montelukast) 10mg  daily at night. Start Advair Diskus 1 puff twice a day and rinse mouth after each use. Let me know if not covered.  May use albuterol rescue inhaler 2 puffs every 4 to 6 hours as needed for shortness of breath, chest tightness, coughing, and wheezing. May use albuterol rescue inhaler 2 puffs 5 to 15 minutes prior to strenuous physical activities. Monitor frequency of use.  Breathing control goals:  Full participation in all desired activities (may need albuterol before activity) Albuterol use two times or less a week on average (not counting use with activity) Cough interfering with sleep two times or less a month Oral steroids no more than once a year No hospitalizations  Skin: Continue proper skin care - use fragrance free and dye free products. Moisturize once day with triamcinolone:Eucerin mix Take hydroxyzine 25mg  1 hour before bedtime ONLY as needed for the itching.  If you get itchy again let me know.   Environmental allergies 2022 skin testing positive to weed pollen, tree pollen, dust mites, cat, cockroach, dog and ragweed pollen, grass.  Continue environmental control measures. Continue Singulair (montelukast) 10mg  daily at night. May use over the counter antihistamines such as Zyrtec (cetirizine), Claritin (loratadine), Allegra (fexofenadine), or Xyzal (levocetirizine) daily as needed as above.  May use Ryaltris (olopatadine + mometasone nasal spray combination) 1-2 sprays per nostril twice a day as needed.  Nasal saline spray (i.e., Simply Saline) or nasal saline lavage (i.e., NeilMed) is recommended as needed and prior to medicated nasal sprays. Consider allergy injections for long term control if above medications do not help the symptoms.  Blurry vision If no improvement, recommend follow up with PCP and eye doctor.  Follow up in 6 months or sooner if needed.

## 2021-11-23 NOTE — Assessment & Plan Note (Signed)
Past history - Reports shortness of breath at times but much better with Singulair and Xyzal.  He also uses Symbicort 160 mcg as needed and has been using it more frequently the last few days. 2022 spirometry shows some restriction with 30% improvement in FEV1 post bronchodilator treatment.  Clinically feeling improved. Interim history - ran out of Airduo x 1 month with no worsening symptoms.  . Daily controller medication(s): Continue Singulair (montelukast) 10mg  daily at night. . Start Advair Diskus 1 puff twice a day and rinse mouth after each use.  Let me know if not covered.  . May use albuterol rescue inhaler 2 puffs every 4 to 6 hours as needed for shortness of breath, chest tightness, coughing, and wheezing. May use albuterol rescue inhaler 2 puffs 5 to 15 minutes prior to strenuous physical activities. Monitor frequency of use.  . Get spirometry at next visit - no spirometry today due to recent hernia surgery.

## 2021-11-23 NOTE — Assessment & Plan Note (Signed)
Past history - Rhinoconjunctivitis symptoms mainly in the spring.  2017 skin testing was positive to trees, cockroach, grass, cat and dust mites.  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 2022 skin testing showed: Positive to weed pollen, tree pollen, dust mites, cat, cockroach, dog and ragweed pollen, grass.  Interim history - controlled.  Continue environmental control measures.  Continue Singulair (montelukast) 10mg  daily at night.  May use over the counter antihistamines such as Zyrtec (cetirizine), Claritin (loratadine), Allegra (fexofenadine), or Xyzal (levocetirizine) daily as needed as above.  . May use Ryaltris (olopatadine + mometasone nasal spray combination) 1-2 sprays per nostril twice a day as needed.   Nasal saline spray (i.e., Simply Saline) or nasal saline lavage (i.e., NeilMed) is recommended as needed and prior to medicated nasal sprays.  Consider allergy injections for long term control if above medications do not help the symptoms.

## 2022-01-12 DIAGNOSIS — Z62820 Parent-biological child conflict: Secondary | ICD-10-CM | POA: Diagnosis not present

## 2022-01-12 DIAGNOSIS — R69 Illness, unspecified: Secondary | ICD-10-CM | POA: Diagnosis not present

## 2022-01-17 DIAGNOSIS — R69 Illness, unspecified: Secondary | ICD-10-CM | POA: Diagnosis not present

## 2022-01-17 DIAGNOSIS — F411 Generalized anxiety disorder: Secondary | ICD-10-CM | POA: Diagnosis not present

## 2022-01-17 DIAGNOSIS — Z62820 Parent-biological child conflict: Secondary | ICD-10-CM | POA: Diagnosis not present

## 2022-01-27 DIAGNOSIS — E041 Nontoxic single thyroid nodule: Secondary | ICD-10-CM | POA: Diagnosis not present

## 2022-01-27 DIAGNOSIS — R931 Abnormal findings on diagnostic imaging of heart and coronary circulation: Secondary | ICD-10-CM | POA: Diagnosis not present

## 2022-01-27 DIAGNOSIS — R7303 Prediabetes: Secondary | ICD-10-CM | POA: Diagnosis not present

## 2022-01-27 DIAGNOSIS — K760 Fatty (change of) liver, not elsewhere classified: Secondary | ICD-10-CM | POA: Diagnosis not present

## 2022-01-27 DIAGNOSIS — I10 Essential (primary) hypertension: Secondary | ICD-10-CM | POA: Diagnosis not present

## 2022-02-02 DIAGNOSIS — F411 Generalized anxiety disorder: Secondary | ICD-10-CM | POA: Diagnosis not present

## 2022-02-02 DIAGNOSIS — R69 Illness, unspecified: Secondary | ICD-10-CM | POA: Diagnosis not present

## 2022-02-02 DIAGNOSIS — Z62820 Parent-biological child conflict: Secondary | ICD-10-CM | POA: Diagnosis not present

## 2022-02-03 DIAGNOSIS — K760 Fatty (change of) liver, not elsewhere classified: Secondary | ICD-10-CM | POA: Diagnosis not present

## 2022-02-03 DIAGNOSIS — G4733 Obstructive sleep apnea (adult) (pediatric): Secondary | ICD-10-CM | POA: Diagnosis not present

## 2022-02-03 DIAGNOSIS — E041 Nontoxic single thyroid nodule: Secondary | ICD-10-CM | POA: Diagnosis not present

## 2022-02-03 DIAGNOSIS — R931 Abnormal findings on diagnostic imaging of heart and coronary circulation: Secondary | ICD-10-CM | POA: Diagnosis not present

## 2022-02-03 DIAGNOSIS — I10 Essential (primary) hypertension: Secondary | ICD-10-CM | POA: Diagnosis not present

## 2022-02-03 DIAGNOSIS — R7303 Prediabetes: Secondary | ICD-10-CM | POA: Diagnosis not present

## 2022-02-03 DIAGNOSIS — J4530 Mild persistent asthma, uncomplicated: Secondary | ICD-10-CM | POA: Diagnosis not present

## 2022-02-03 DIAGNOSIS — E782 Mixed hyperlipidemia: Secondary | ICD-10-CM | POA: Diagnosis not present

## 2022-02-03 DIAGNOSIS — Z23 Encounter for immunization: Secondary | ICD-10-CM | POA: Diagnosis not present

## 2022-02-03 DIAGNOSIS — N182 Chronic kidney disease, stage 2 (mild): Secondary | ICD-10-CM | POA: Diagnosis not present

## 2022-03-02 ENCOUNTER — Telehealth: Payer: Self-pay | Admitting: Orthopedic Surgery

## 2022-03-02 DIAGNOSIS — F411 Generalized anxiety disorder: Secondary | ICD-10-CM | POA: Diagnosis not present

## 2022-03-02 DIAGNOSIS — Z62820 Parent-biological child conflict: Secondary | ICD-10-CM | POA: Diagnosis not present

## 2022-03-02 DIAGNOSIS — R69 Illness, unspecified: Secondary | ICD-10-CM | POA: Diagnosis not present

## 2022-03-02 NOTE — Telephone Encounter (Signed)
Patient called asked if he can be worked into Dr. August Saucer or Leane Call schedule for the pain he is having in his neck and left shoulder.  The number to contact patient is  [phone]

## 2022-03-04 ENCOUNTER — Ambulatory Visit: Payer: 59 | Admitting: Physician Assistant

## 2022-03-04 ENCOUNTER — Ambulatory Visit (INDEPENDENT_AMBULATORY_CARE_PROVIDER_SITE_OTHER): Payer: 59

## 2022-03-04 ENCOUNTER — Encounter: Payer: Self-pay | Admitting: Sports Medicine

## 2022-03-04 ENCOUNTER — Encounter: Payer: Self-pay | Admitting: Physician Assistant

## 2022-03-04 ENCOUNTER — Ambulatory Visit: Payer: 59 | Admitting: Sports Medicine

## 2022-03-04 ENCOUNTER — Ambulatory Visit: Payer: Self-pay

## 2022-03-04 DIAGNOSIS — M25512 Pain in left shoulder: Secondary | ICD-10-CM

## 2022-03-04 DIAGNOSIS — M19012 Primary osteoarthritis, left shoulder: Secondary | ICD-10-CM

## 2022-03-04 DIAGNOSIS — M542 Cervicalgia: Secondary | ICD-10-CM | POA: Diagnosis not present

## 2022-03-04 MED ORDER — METHYLPREDNISOLONE ACETATE 40 MG/ML IJ SUSP
80.0000 mg | INTRAMUSCULAR | Status: AC | PRN
  Administered 2022-03-04: 80 mg via INTRA_ARTICULAR

## 2022-03-04 MED ORDER — BUPIVACAINE HCL 0.25 % IJ SOLN
2.0000 mL | INTRAMUSCULAR | Status: AC | PRN
  Administered 2022-03-04: 2 mL via INTRA_ARTICULAR

## 2022-03-04 MED ORDER — LIDOCAINE HCL 1 % IJ SOLN
2.0000 mL | INTRAMUSCULAR | Status: AC | PRN
  Administered 2022-03-04: 2 mL

## 2022-03-04 NOTE — Progress Notes (Signed)
Office Visit Note   Patient: Nathaniel Robertson           Date of Birth: [birth year]           MRN: [id number] Visit Date: 03/04/2022              Requested by: Georgianne Fick, MD [address] [place],  [place] [address] PCP: Georgianne Fick, MD  Chief Complaint  Patient presents with   Neck - Pain   Left Shoulder - Pain      HPI: Patient is a pleasant 61 year old gentleman with a chief complaint of left shoulder pain.  He denies any specific injuries.  He says his left shoulder is sore goes down into his deltoid.  Does denies any specific weakness or paresthesias in his hand.  He says he has trouble with pain in all directions of his left shoulder and uses his other arm to help it.  He does have a history of arthritis in his right shoulder and did get an ultrasound-guided injection a couple years ago.  He said the feels similar.  And mentions that he was offered an injection into the left shoulder as well but declined because the right was more painful denies any specific injury  Assessment & Plan: Visit Diagnoses:  1. Acute pain of left shoulder   2. Neck pain     Plan: Will he does have a component of some neck pain and shoulder pain I think the shoulder is clearly the more bothersome to him he does have some degenerative changes on x-ray similar to the right shoulder.  He did get good relief with a ultrasound-guided injection into the right shoulder of the glenohumeral joint.  He does have pain in all directions especially with external rotation.  I would first try a glenohumeral injection to see how much this helps him.  Should follow-up in a couple weeks.  Will arrange for him to follow-up with Dr. Shon Baton this afternoon for an injection  Follow-Up Instructions: Return in about 2 weeks (around 03/18/2022).   Ortho Exam  Patient is alert, oriented, no adenopathy, well-dressed, normal affect, normal respiratory effort. Left shoulder: He has global tenderness  over the glenohumeral joint and over the [facility] joint.  He has forward elevation but uses his other arm to assist he says because of pain not because of weakness.  He has pain with external rotation and some limitation of motion.  Internal rotation behind the back is also very painful to him.  Examination of his neck he has some tenderness with forward flexion.  None with extension or side-to-side turning.  He is neurovascularly intact with good grip strength  Imaging: XR Cervical Spine 2 or 3 views  Result Date: 03/04/2022 2 views of the cervical spine demonstrate some loss of normal lordotic curve.  He has some degenerative changes especially at C5-C6 C6-C7 well-maintained alignment no acute changes  XR Shoulder Left  Result Date: 03/04/2022 Radiographs of his left shoulder were taken in multiple projections.  He does have some sclerotic changes as well as loss of joint space.  Findings consistent with arthritis.  No acute fractures.  He does have some hypertrophic bone formation over the acromioclavicular joint  No images are attached to the encounter.  Labs: Lab Results  Component Value Date   ESRSEDRATE 5 05/21/2020   CRP <1 05/21/2020     Lab Results  Component Value Date   ALBUMIN 4.7 05/21/2020    No results found for: "  MG" No results found for: "VD25OH"  No results found for: "PREALBUMIN"    Latest Ref Rng & Units 05/21/2020    4:36 PM 02/17/2017    9:32 PM 09/16/2012    8:50 PM  CBC EXTENDED  WBC 3.4 - 10.8 x10E3/uL 8.1  8.2  6.9   RBC 4.14 - 5.80 x10E6/uL 5.13  4.98  4.37   Hemoglobin 13.0 - 17.7 g/dL 95.6  38.7  56.4   HCT 37.5 - 51.0 % 42.3  41.5  36.1   Platelets 150 - 450 x10E3/uL 267  199  177   NEUT# 1.4 - 7.0 x10E3/uL 4.6     Lymph# 0.7 - 3.1 x10E3/uL 2.7        There is no height or weight on file to calculate BMI.  Orders:  Orders Placed This Encounter  Procedures   XR Shoulder Left   XR Cervical Spine 2 or 3 views   No orders of the defined types  were placed in this encounter.    Procedures: No procedures performed  Clinical Data: No additional findings.  ROS:  All other systems negative, except as noted in the HPI. Review of Systems  Objective: Vital Signs: There were no vitals taken for this visit.  Specialty Comments:  No specialty comments available.  PMFS History: Patient Active Problem List   Diagnosis Date Noted   Blurry vision 11/23/2021   Seasonal and perennial allergic rhinoconjunctivitis 06/18/2020   Asthma 05/21/2020   Pruritus 05/21/2020   HTN, goal below 130/80 11/12/2017   Severe obstructive sleep apnea 11/12/2017   Snoring 12/31/2014   Hypersomnia with sleep apnea 12/31/2014   Obesity (BMI 35.0-39.9 without comorbidity) 12/31/2014   Past Medical History:  Diagnosis Date   CKD (chronic kidney disease)    stage 3   Diabetes mellitus without complication (HCC)    Essential hypertension    Hypertension    Impaired fasting glucose    Mild persistent asthma without complication    Mixed hyperlipidemia    Obstructive sleep apnea    lost weight, no CPAP now   Rash    Snoring 12/31/2014    Family History  Problem Relation Age of Onset   Diabetes Mother    Asthma Father     Past Surgical History:  Procedure Laterality Date   COLONOSCOPY     INSERTION OF MESH  10/13/2021   Procedure: INSERTION OF MESH;  Surgeon: Manus Rudd, MD;  Location: [facility];  Service: General;;   UMBILICAL HERNIA REPAIR N/A 10/13/2021   Procedure: OPEN UMBILICAL HERNIA REPAIR WITH MESH;  Surgeon: Manus Rudd, MD;  Location: [facility];  Service: General;  Laterality: N/A;   Social History   Occupational History   Occupation: travel agent  Tobacco Use   Smoking status: Never   Smokeless tobacco: Never  Vaping Use   Vaping Use: Never used  Substance and Sexual Activity   Alcohol use: Yes    Comment: occasional   Drug use: No   Sexual activity: Yes

## 2022-03-04 NOTE — Progress Notes (Signed)
   Procedure Note  Patient: Nathaniel Robertson             Date of Birth: [birth year]           MRN: [id number]             Visit Date: 03/04/2022  Procedures: Visit Diagnoses:  1. Acute pain of left shoulder   2. Primary osteoarthritis, left shoulder    Large Joint Inj: L glenohumeral on 03/04/2022 1:27 PM Indications: pain Details: 22 G 3.5 in needle, ultrasound-guided posterior approach Medications: 2 mL lidocaine 1 %; 2 mL bupivacaine 0.25 %; 80 mg methylPREDNISolone acetate 40 MG/ML Outcome: tolerated well, no immediate complications  US-guided glenohumeral joint injection, left shoulder After discussion on risks/benefits/indications, informed verbal consent was obtained. A timeout was then performed. The patient was positioned lying lateral recumbent on examination table. The patient's shoulder was prepped with betadine and multiple alcohol swabs and utilizing ultrasound guidance, the patient's glenohumeral joint was identified on ultrasound. Using ultrasound guidance a 22-gauge, 3.5 inch needle with a mixture of 2:2:2 cc's lidocaine:bupivicaine:depomedrol was directed from a lateral to medial direction via in-plane technique into the glenohumeral joint with visualization of appropriate spread of injectate into the joint. Patient tolerated the procedure well without immediate complications.      Procedure, treatment alternatives, risks and benefits explained, specific risks discussed. Consent was given by the patient. Immediately prior to procedure a time out was called to verify the correct patient, procedure, equipment, support staff and site/side marked as required. Patient was prepped and draped in the usual sterile fashion.     - I evaluated the patient about 10 minutes post-injection and he had great improvement in pain and range of motion - follow-up with [name], PA-C as indicated; I am happy to see them as needed  Madelyn Brunner, DO Primary Care Sports Medicine  Physician  [facility] - Orthopedics  This note was dictated using Dragon naturally speaking software and may contain errors in syntax, spelling, or content which have not been identified prior to signing this note.

## 2022-03-11 ENCOUNTER — Ambulatory Visit: Payer: 59 | Admitting: Sports Medicine

## 2022-03-11 ENCOUNTER — Encounter: Payer: Self-pay | Admitting: Sports Medicine

## 2022-03-11 DIAGNOSIS — M25512 Pain in left shoulder: Secondary | ICD-10-CM | POA: Diagnosis not present

## 2022-03-11 DIAGNOSIS — R293 Abnormal posture: Secondary | ICD-10-CM

## 2022-03-11 DIAGNOSIS — M62838 Other muscle spasm: Secondary | ICD-10-CM | POA: Diagnosis not present

## 2022-03-11 DIAGNOSIS — M67912 Unspecified disorder of synovium and tendon, left shoulder: Secondary | ICD-10-CM | POA: Diagnosis not present

## 2022-03-11 MED ORDER — CELECOXIB 200 MG PO CAPS: 200.0000 mg | ORAL_CAPSULE | Freq: Every day | ORAL | 1 refills | Status: DC

## 2022-03-11 MED ORDER — LIDOCAINE HCL 1 % IJ SOLN
2.0000 mL | INTRAMUSCULAR | Status: AC | PRN
  Administered 2022-03-11: 2 mL

## 2022-03-11 MED ORDER — METHYLPREDNISOLONE ACETATE 40 MG/ML IJ SUSP
40.0000 mg | INTRAMUSCULAR | Status: AC | PRN
  Administered 2022-03-11: 40 mg via INTRA_ARTICULAR

## 2022-03-11 MED ORDER — BUPIVACAINE HCL 0.25 % IJ SOLN
2.0000 mL | INTRAMUSCULAR | Status: AC | PRN
  Administered 2022-03-11: 2 mL via INTRA_ARTICULAR

## 2022-03-11 MED ORDER — CELECOXIB 200 MG PO CAPS: 200.0000 mg | ORAL_CAPSULE | Freq: Every day | ORAL | 1 refills | Status: AC

## 2022-03-11 NOTE — Progress Notes (Signed)
Does not feel like the first injection did much  Possible 2nd one today

## 2022-03-11 NOTE — Progress Notes (Signed)
Nathaniel Robertson - 61 y.o. male MRN [id number]  Date of birth: 16-May-1960  Office Visit Note: Visit Date: 03/11/2022 PCP: Georgianne Fick, MD Referred by: Georgianne Fick, MD  Subjective: Chief Complaint  Patient presents with   Left Shoulder - Pain, Follow-up   HPI: Nathaniel Robertson is a pleasant 61 y.o. male who presents today for follow-up of left shoulder pain.  His neck and right shoulder have started to bother him as well.  He previously saw Meritene and she had me perform an US-guided GHJ injection on 03/04/22 which did not provide him any relief.  The pain continues in the left shoulder, he points to the lateral shoulder and the proximal deltoid.  He has difficulty with range of motion and has to lift his hand up to comb his hair.  He also notes some upper neck pain and tight trap muscles.  He has been applying heat to the neck and trapezius muscles and states this does help improve his pain.  Taking Tylenol occasionally, but nothing consistently.  He has not done any therapy.  He also feels like the right shoulder is starting to feel somewhat painful as well.  He denies any numbness or tingling or radicular pain down the arm or into the fingers.  Pertinent ROS were reviewed with the patient and found to be negative unless otherwise specified above in HPI.   Assessment & Plan: Visit Diagnoses:  1. Acute pain of left shoulder   2. Rotator cuff dysfunction, left   3. Trapezius muscle spasm   4. Posture abnormality    Plan: I did review Robbie's previous x-rays and perform shoulder provocative testing today, his exam suggest evidence of rotator cuff arthropathy or chronic tearing as he does have weakness as well as weakness to pain.  His pain is significantly impeding upon his quality of life, having difficulty sleeping.  We discussed all treatment options such as NSAIDs, physical therapy, home rehab, and injection therapy. The degree of his pain, we did elect to proceed with  subacromial joint injection.  I will also start him on Celebrex 200 mg once daily.  We did discontinue his diclofenac that he was taking in the past, he has not taken this recently.  We did give him a handout and demonstrate rotator cuff exercises for him to begin starting Sunday.  I would also like him to get some dedicated hands-on formal physical therapy for both rotator cuffs as well as postural changes and trapezius hypertonicity.  We will see him back in 6 weeks.  If he is not finding the benefit we felt, next step would likely be MRI of the left shoulder.  Follow-up: Return in about 6 weeks (around 04/22/2022) for with Dr. Shon Baton for L-shoulder.   Meds & Orders:  Meds ordered this encounter  Medications   DISCONTD: celecoxib (CELEBREX) 200 MG capsule    Sig: Take 1 capsule (200 mg total) by mouth daily.    Dispense:  30 capsule    Refill:  1   celecoxib (CELEBREX) 200 MG capsule    Sig: Take 1 capsule (200 mg total) by mouth daily.    Dispense:  30 capsule    Refill:  1    Orders Placed This Encounter  Procedures   Ambulatory referral to Physical Therapy     Procedures: Large Joint Inj: L subacromial bursa on 03/11/2022 8:55 AM Indications: pain Details: 22 G 1.5 in needle, posterior approach Medications: 2 mL lidocaine 1 %; 2 mL bupivacaine 0.25 %;  40 mg methylPREDNISolone acetate 40 MG/ML Outcome: tolerated well, no immediate complications  Subacromial Joint Injection, Left Shoulder After discussion on risks/benefits/indications, informed verbal consent was obtained. A timeout was then performed. Patient was seated on table in exam room. The patient's shoulder was prepped with betadine and alcohol swabs and utilizing posterior approach a 22G, 1.5" needle was directed anteriorly and laterally into the patient's subacromial space was injected with 2:2:1 mixture of lidocaine:bupivicaine:depomedrol with appreciation of free-flowing of the injectate into the bursal space. Patient  tolerated the procedure well without immediate complications.   Procedure, treatment alternatives, risks and benefits explained, specific risks discussed. Consent was given by the patient. Immediately prior to procedure a time out was called to verify the correct patient, procedure, equipment, support staff and site/side marked as required. Patient was prepped and draped in the usual sterile fashion.          Clinical History: No specialty comments available.  He reports that he has never smoked. He has never used smokeless tobacco. No results for input(s): "HGBA1C", "LABURIC" in the last 8760 hours.  Objective:    Physical Exam  Gen: Well-appearing, in no acute distress; non-toxic CV: Regular Rate. Well-perfused. Warm.  Resp: Breathing unlabored on room air; no wheezing. Psych: Fluid speech in conversation; appropriate affect; normal thought process Neuro: Sensation intact throughout. No gross coordination deficits.   Ortho Exam - Left shoulder: Positive TTP at Codman's point.  No erythema or ecchymosis.  There is full passive range of motion, although limited active range of motion in forward flexion and abduction.  Positive drop arm test.  Pain and weakness with empty can testing and resisted external rotation.  Speed's Test negative.  Negative impingement testing.  There is some limitation with range of motion from pain as well as blocking with external rotation at 90 degrees.  There is some compensatory left abnormal scapular motion observed.  - Cervical spine: No gross deformity or asymmetry, no swelling or overlying skin changes.  There is tenderness to palpation over the left greater than right lower cervical paraspinal musculature.  Positive TTP and notable hypertonicity of the left trapezius muscle belly.  There is full range of motion with flexion and extension, although a pulling sensation with flexion.  There is weakness with rotator cuff testing, otherwise 5/5 strength in the  C5-T1 nerve root distribution.  Negative Spurling's test bilaterally.  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  There is a slight loss of the normal cervical lordosis.  No acute bony fracture.  *Independent review of the left shoulder x-ray from 03/04/2022 shows 3 views showing at least moderate glenohumeral joint arthritis.  Small spur off the inferior aspect of the humeral head.  Mild AC joint arthropathy.   XR Cervical Spine 2 or 3 views 2 views of the cervical spine demonstrate some loss of normal lordotic  curve.  He has some degenerative changes especially at C5-C6 C6-C7  well-maintained alignment no acute changes XR Shoulder Left Radiographs of his left shoulder were taken in multiple projections.  He  does have some sclerotic changes as well as loss of joint space.  Findings  consistent with arthritis.  No acute fractures.  He does have some  hypertrophic bone formation over the acromioclavicular joint    Past Medical/Family/Surgical/Social History: Medications & Allergies reviewed per EMR, new medications updated. Patient Active Problem List   Diagnosis Date Noted   Blurry vision 11/23/2021  Seasonal and perennial allergic rhinoconjunctivitis 06/18/2020   Asthma 05/21/2020   Pruritus 05/21/2020   HTN, goal below 130/80 11/12/2017   Severe obstructive sleep apnea 11/12/2017   Snoring 12/31/2014   Hypersomnia with sleep apnea 12/31/2014   Obesity (BMI 35.0-39.9 without comorbidity) 12/31/2014   Past Medical History:  Diagnosis Date   CKD (chronic kidney disease)    stage 3   Diabetes mellitus without complication (HCC)    Essential hypertension    Hypertension    Impaired fasting glucose    Mild persistent asthma without complication    Mixed hyperlipidemia    Obstructive sleep apnea    lost weight, no CPAP now   Rash    Snoring 12/31/2014   Family History  Problem Relation Age of Onset    Diabetes Mother    Asthma Father    Past Surgical History:  Procedure Laterality Date   COLONOSCOPY     INSERTION OF MESH  10/13/2021   Procedure: INSERTION OF MESH;  Surgeon: Manus Rudd, MD;  Location: [facility];  Service: General;;   UMBILICAL HERNIA REPAIR N/A 10/13/2021   Procedure: OPEN UMBILICAL HERNIA REPAIR WITH MESH;  Surgeon: Manus Rudd, MD;  Location:  SURGERY CENTER;  Service: General;  Laterality: N/A;   Social History   Occupational History   Occupation: travel agent  Tobacco Use   Smoking status: Never   Smokeless tobacco: Never  Vaping Use   Vaping Use: Never used  Substance and Sexual Activity   Alcohol use: Yes    Comment: occasional   Drug use: No   Sexual activity: Yes

## 2022-03-16 DIAGNOSIS — F411 Generalized anxiety disorder: Secondary | ICD-10-CM | POA: Diagnosis not present

## 2022-03-16 DIAGNOSIS — Z62820 Parent-biological child conflict: Secondary | ICD-10-CM | POA: Diagnosis not present

## 2022-03-16 DIAGNOSIS — R69 Illness, unspecified: Secondary | ICD-10-CM | POA: Diagnosis not present

## 2022-04-21 ENCOUNTER — Ambulatory Visit: Payer: 59 | Admitting: Sports Medicine

## 2022-04-22 ENCOUNTER — Other Ambulatory Visit: Payer: Self-pay | Admitting: Allergy

## 2022-05-26 ENCOUNTER — Ambulatory Visit: Payer: 59 | Admitting: Allergy

## 2022-05-26 DIAGNOSIS — J309 Allergic rhinitis, unspecified: Secondary | ICD-10-CM

## 2022-05-26 NOTE — Progress Notes (Deleted)
Follow Up Note  RE: Nathaniel Robertson MRN: [id number] DOB: 08-Jul-1960 Date of Office Visit: 05/26/2022  Referring provider: Merrilee Seashore, MD Primary care provider: Merrilee Seashore, MD  Chief Complaint: No chief complaint on file.  History of Present Illness: I had the pleasure of seeing Nathaniel Robertson for a follow up visit at the [facility]  on 05/26/2022. He is a 62 y.o. male, who is being followed for pruritus, asthma, allergic rhinoconjunctivitis. His previous allergy office visit was on 11/23/2021 with Dr. Maudie Mercury. Today is a regular follow up visit.  Pruritus Past history - Whole body pruritus for many years and now having some hyperpigmented excoriating changes on the skin.  Patient was seen by allergy in 2017 and dermatology in the past with no identifiable triggers.  2022 skin testing showed: Positive to weed pollen, tree pollen, dust mites, cat, cockroach, dog and ragweed pollen, grass. Borderline positive to soy and almonds.  Interim history - stopped Dupixent in March due to cost. No flare in symptoms. Continue proper skin care - use fragrance free and dye free products. Moisturize once day with triamcinolone:Eucerin mix Take hydroxyzine '25mg'$  1 hour before bedtime ONLY as needed for the itching.  If you get itchy again let me know.    Asthma Past history - Reports shortness of breath at times but much better with Singulair and Xyzal.  He also uses Symbicort 160 mcg as needed and has been using it more frequently the last few days. 2022 spirometry shows some restriction with 30% improvement in FEV1 post bronchodilator treatment.  Clinically feeling improved. Interim history - ran out of Airduo x 1 month with no worsening symptoms.  Daily controller medication(s): Continue Singulair (montelukast) '10mg'$  daily at night. Start Advair Diskus 268mg 1 puff twice a day and rinse mouth after each use. Let me know if not covered.  May use albuterol rescue inhaler 2  puffs every 4 to 6 hours as needed for shortness of breath, chest tightness, coughing, and wheezing. May use albuterol rescue inhaler 2 puffs 5 to 15 minutes prior to strenuous physical activities. Monitor frequency of use.  Get spirometry at next visit - no spirometry today due to recent hernia surgery.    Seasonal and perennial allergic rhinoconjunctivitis Past history - Rhinoconjunctivitis symptoms mainly in the spring.  2017 skin testing was positive to trees, cockroach, grass, cat and dust mites.  [place]. 2022 skin testing showed: Positive to weed pollen, tree pollen, dust mites, cat, cockroach, dog and ragweed pollen, grass.  Interim history - controlled. Continue environmental control measures. Continue Singulair (montelukast) '10mg'$  daily at night. May use over the counter antihistamines such as Zyrtec (cetirizine), Claritin (loratadine), Allegra (fexofenadine), or Xyzal (levocetirizine) daily as needed as above.  May use Ryaltris (olopatadine + mometasone nasal spray combination) 1-2 sprays per nostril twice a day as needed.  Nasal saline spray (i.e., Simply Saline) or nasal saline lavage (i.e., NeilMed) is recommended as needed and prior to medicated nasal sprays. Consider allergy injections for long term control if above medications do not help the symptoms.   Blurry vision In the mornings.  If no improvement, recommend follow up with PCP and eye doctor.  Assessment and Plan: Nathaniel Robertson is a 62y.o. male with: No problem-specific Assessment & Plan notes found for this encounter.  No follow-ups on file.  No orders of the defined types were placed in this encounter.  Lab Orders  No laboratory test(s) ordered today    Diagnostics: Spirometry:  Tracings reviewed. His effort: {Blank single:19197::"Good reproducible efforts.","It was hard to get consistent efforts and there is a question as to whether this reflects a maximal  maneuver.","Poor effort, data can not be interpreted."} FVC: ***L FEV1: ***L, ***% predicted FEV1/FVC ratio: ***% Interpretation: {Blank single:19197::"Spirometry consistent with mild obstructive disease","Spirometry consistent with moderate obstructive disease","Spirometry consistent with severe obstructive disease","Spirometry consistent with possible restrictive disease","Spirometry consistent with mixed obstructive and restrictive disease","Spirometry uninterpretable due to technique","Spirometry consistent with normal pattern","No overt abnormalities noted given today's efforts"}.  Please see scanned spirometry results for details.  Skin Testing: {Blank single:19197::"Select foods","Environmental allergy panel","Environmental allergy panel and select foods","Food allergy panel","None","Deferred due to recent antihistamines use"}. *** Results discussed with patient/family.   Medication List:  Current Outpatient Medications  Medication Sig Dispense Refill  . albuterol (PROVENTIL HFA;VENTOLIN HFA) 108 (90 BASE) MCG/ACT inhaler Inhale 2 puffs into the lungs every 6 (six) hours as needed for wheezing or shortness of breath.    Marland Kitchen amLODipine (NORVASC) 10 MG tablet TK 1 T PO QD  2  . atorvastatin (LIPITOR) 10 MG tablet TK 1 T PO  D. GEF LIPITOR  1  . Cholecalciferol (VITAMIN D-1000 MAX ST PO) Take by mouth.    . EPINEPHrine 0.3 mg/0.3 mL IJ SOAJ injection Use as directed for life-threatening allergic reaction. 1 each 2  . fluticasone-salmeterol (ADVAIR) 250-50 MCG/ACT AEPB Inhale 1 puff into the lungs in the morning and at bedtime. Rinse mouth after each use. 60 each 5  . hydrOXYzine (ATARAX) 25 MG tablet Take 1 tablet (25 mg total) by mouth at bedtime as needed for itching. 30 tablet 2  . levocetirizine (XYZAL) 5 MG tablet Take 5 mg by mouth every evening.    Marland Kitchen losartan (COZAAR) 100 MG tablet Take 100 mg by mouth daily.  0  . metFORMIN (GLUCOPHAGE-XR) 500 MG 24 hr tablet Take 500 mg by mouth at  bedtime.   0  . montelukast (SINGULAIR) 10 MG tablet TAKE 1 TABLET BY MOUTH AT BEDTIME 90 tablet 0  . Omega 3-6-9 Fatty Acids (OMEGA 3-6-9 COMPLEX PO) Take by mouth.    . oxyCODONE (OXY IR/ROXICODONE) 5 MG immediate release tablet Take 1 tablet (5 mg total) by mouth every 6 (six) hours as needed for severe pain. 15 tablet 0  . triamcinolone ointment (KENALOG) 0.1 % Apply 1 application topically 2 (two) times daily. Use as moisturizer 453 g 1   No current facility-administered medications for this visit.   Allergies: No Known Allergies I reviewed his past medical history, social history, family history, and environmental history and no significant changes have been reported from his previous visit.  Review of Systems  Constitutional:  Negative for appetite change, chills, fever and unexpected weight change.  HENT:  Negative for postnasal drip and rhinorrhea.   Eyes:  Negative for itching.  Respiratory:  Negative for cough, chest tightness, shortness of breath and wheezing.   Cardiovascular:  Negative for chest pain.  Gastrointestinal:  Negative for abdominal pain.  Genitourinary:  Negative for difficulty urinating.  Skin:  Negative for rash.  Allergic/Immunologic: Positive for environmental allergies.  Neurological:  Negative for headaches.   Objective: There were no vitals taken for this visit. There is no height or weight on file to calculate BMI. Physical Exam Vitals and nursing note reviewed.  Constitutional:      Appearance: Normal appearance. He is well-developed.  HENT:     Head: Normocephalic and atraumatic.  Right Ear: Tympanic membrane and external ear normal.     Left Ear: Tympanic membrane and external ear normal.     Nose: Nose normal.     Mouth/Throat:     Mouth: Mucous membranes are moist.     Pharynx: Oropharynx is clear.  Eyes:     Conjunctiva/sclera: Conjunctivae normal.  Cardiovascular:     Rate and Rhythm: Normal rate and regular rhythm.     Heart  sounds: Normal heart sounds. No murmur heard.    No friction rub. No gallop.  Pulmonary:     Effort: Pulmonary effort is normal.     Breath sounds: Normal breath sounds. No wheezing, rhonchi or rales.  Musculoskeletal:     Cervical back: Neck supple.  Skin:    General: Skin is warm.     Findings: No rash.     Comments: Circular scarring on upper and lower extremities b/l.  Neurological:     Mental Status: He is alert and oriented to person, place, and time.  Psychiatric:        Behavior: Behavior normal.  Previous notes and tests were reviewed. The plan was reviewed with the patient/family, and all questions/concerned were addressed.  It was my pleasure to see Aithan today and participate in his care. Please feel free to contact me with any questions or concerns.  Sincerely,  Rexene Alberts, DO Allergy & Immunology  [facility] office: [place] office: [phone]

## 2022-06-14 ENCOUNTER — Other Ambulatory Visit: Payer: Self-pay | Admitting: Internal Medicine

## 2022-06-14 DIAGNOSIS — E041 Nontoxic single thyroid nodule: Secondary | ICD-10-CM

## 2022-07-14 ENCOUNTER — Ambulatory Visit
Admission: RE | Admit: 2022-07-14 | Discharge: 2022-07-14 | Disposition: A | Discharge status: Home / Self Care | Payer: 59 | Source: Ambulatory Visit | Attending: Internal Medicine | Admitting: Internal Medicine

## 2022-07-14 DIAGNOSIS — E041 Nontoxic single thyroid nodule: Secondary | ICD-10-CM

## 2022-08-11 DIAGNOSIS — I10 Essential (primary) hypertension: Secondary | ICD-10-CM | POA: Diagnosis not present

## 2022-08-11 DIAGNOSIS — Z0000 Encounter for general adult medical examination without abnormal findings: Secondary | ICD-10-CM | POA: Diagnosis not present

## 2022-08-11 DIAGNOSIS — E041 Nontoxic single thyroid nodule: Secondary | ICD-10-CM | POA: Diagnosis not present

## 2022-08-11 DIAGNOSIS — J4530 Mild persistent asthma, uncomplicated: Secondary | ICD-10-CM | POA: Diagnosis not present

## 2022-08-11 DIAGNOSIS — R931 Abnormal findings on diagnostic imaging of heart and coronary circulation: Secondary | ICD-10-CM | POA: Diagnosis not present

## 2022-08-11 DIAGNOSIS — R7303 Prediabetes: Secondary | ICD-10-CM | POA: Diagnosis not present

## 2022-08-11 DIAGNOSIS — K760 Fatty (change of) liver, not elsewhere classified: Secondary | ICD-10-CM | POA: Diagnosis not present

## 2022-08-11 DIAGNOSIS — E782 Mixed hyperlipidemia: Secondary | ICD-10-CM | POA: Diagnosis not present

## 2022-08-11 DIAGNOSIS — G4733 Obstructive sleep apnea (adult) (pediatric): Secondary | ICD-10-CM | POA: Diagnosis not present

## 2022-08-11 DIAGNOSIS — Z125 Encounter for screening for malignant neoplasm of prostate: Secondary | ICD-10-CM | POA: Diagnosis not present

## 2022-08-18 DIAGNOSIS — R7303 Prediabetes: Secondary | ICD-10-CM | POA: Diagnosis not present

## 2022-08-18 DIAGNOSIS — G4733 Obstructive sleep apnea (adult) (pediatric): Secondary | ICD-10-CM | POA: Diagnosis not present

## 2022-08-18 DIAGNOSIS — N5089 Other specified disorders of the male genital organs: Secondary | ICD-10-CM | POA: Diagnosis not present

## 2022-08-18 DIAGNOSIS — J4530 Mild persistent asthma, uncomplicated: Secondary | ICD-10-CM | POA: Diagnosis not present

## 2022-08-18 DIAGNOSIS — K760 Fatty (change of) liver, not elsewhere classified: Secondary | ICD-10-CM | POA: Diagnosis not present

## 2022-08-18 DIAGNOSIS — L209 Atopic dermatitis, unspecified: Secondary | ICD-10-CM | POA: Diagnosis not present

## 2022-08-18 DIAGNOSIS — E041 Nontoxic single thyroid nodule: Secondary | ICD-10-CM | POA: Diagnosis not present

## 2022-08-18 DIAGNOSIS — N1831 Chronic kidney disease, stage 3a: Secondary | ICD-10-CM | POA: Diagnosis not present

## 2022-08-18 DIAGNOSIS — I10 Essential (primary) hypertension: Secondary | ICD-10-CM | POA: Diagnosis not present

## 2022-08-18 DIAGNOSIS — R931 Abnormal findings on diagnostic imaging of heart and coronary circulation: Secondary | ICD-10-CM | POA: Diagnosis not present

## 2022-08-18 DIAGNOSIS — E782 Mixed hyperlipidemia: Secondary | ICD-10-CM | POA: Diagnosis not present

## 2022-08-18 DIAGNOSIS — Z0000 Encounter for general adult medical examination without abnormal findings: Secondary | ICD-10-CM | POA: Diagnosis not present

## 2022-08-19 ENCOUNTER — Other Ambulatory Visit: Payer: Self-pay | Admitting: Internal Medicine

## 2022-08-19 DIAGNOSIS — E041 Nontoxic single thyroid nodule: Secondary | ICD-10-CM

## 2022-09-06 ENCOUNTER — Other Ambulatory Visit: Payer: Self-pay | Admitting: Internal Medicine

## 2022-09-06 DIAGNOSIS — Z0000 Encounter for general adult medical examination without abnormal findings: Secondary | ICD-10-CM

## 2022-09-06 DIAGNOSIS — N5089 Other specified disorders of the male genital organs: Secondary | ICD-10-CM

## 2022-09-09 ENCOUNTER — Other Ambulatory Visit: Payer: 59

## 2022-09-09 ENCOUNTER — Ambulatory Visit
Admission: RE | Admit: 2022-09-09 | Discharge: 2022-09-09 | Disposition: A | Discharge status: Home / Self Care | Payer: 59 | Source: Ambulatory Visit | Attending: Internal Medicine | Admitting: Internal Medicine

## 2022-09-09 DIAGNOSIS — N503 Cyst of epididymis: Secondary | ICD-10-CM | POA: Diagnosis not present

## 2022-09-09 DIAGNOSIS — N5089 Other specified disorders of the male genital organs: Secondary | ICD-10-CM | POA: Diagnosis not present

## 2022-09-09 DIAGNOSIS — Z0000 Encounter for general adult medical examination without abnormal findings: Secondary | ICD-10-CM

## 2022-09-26 IMAGING — US US THYROID
1 series · 13 of 25 positions shown · non-contrast
Comparison: 09/09/2019

CLINICAL DATA: Thyroid nodule follow-up

EXAM:
THYROID ULTRASOUND
TECHNIQUE: Ultrasound examination of the thyroid gland and adjacent soft
tissues was performed.

[Series 1: us thyroid · 0.06mm/px · 51 acquisitions, 13 frames shown]
[im 1/51]
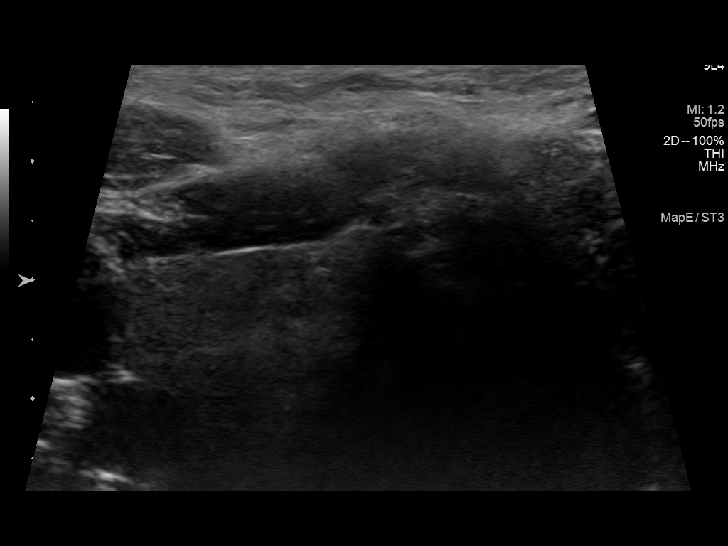
[im 5/51]
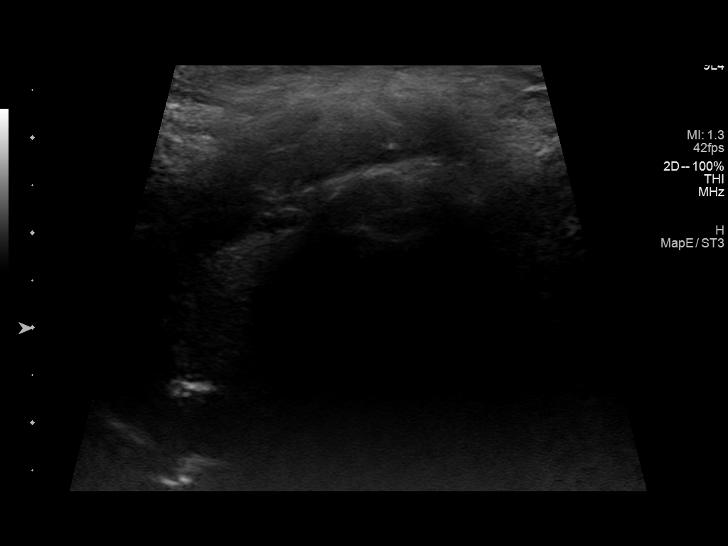
[im 9/51]
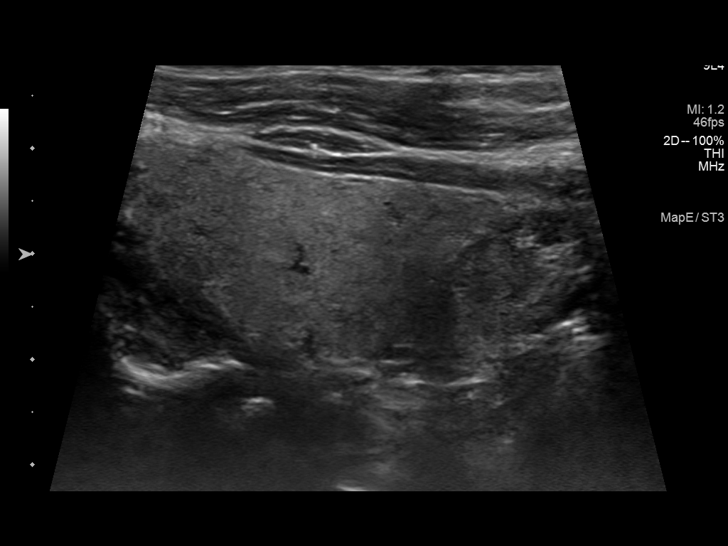
[im 13/51]
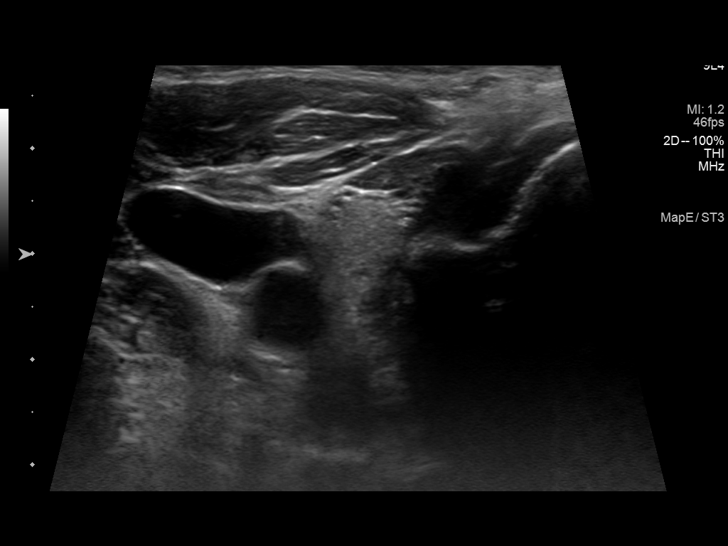
[im 17/51]
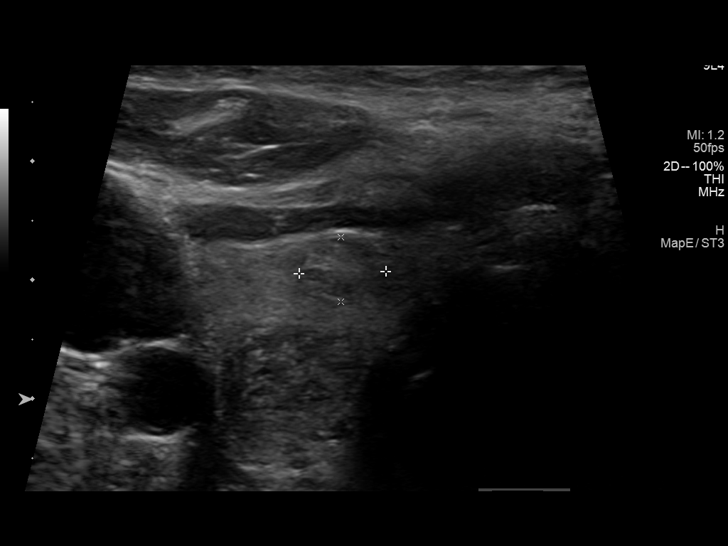
[im 21/51]
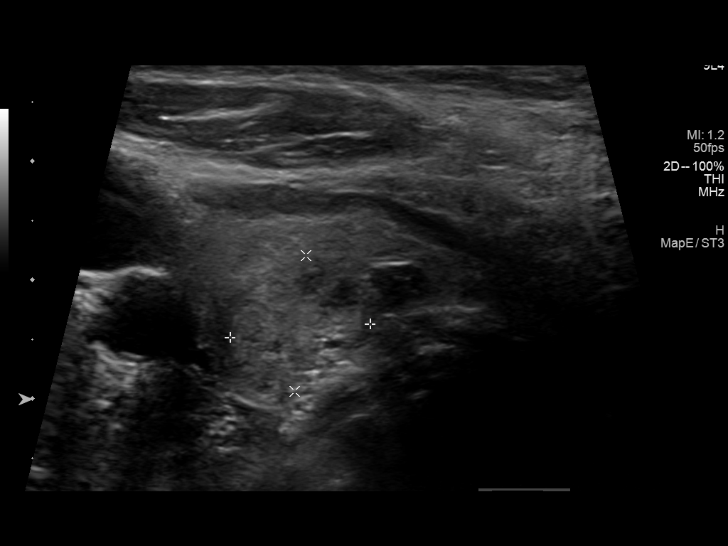
[im 26/51]
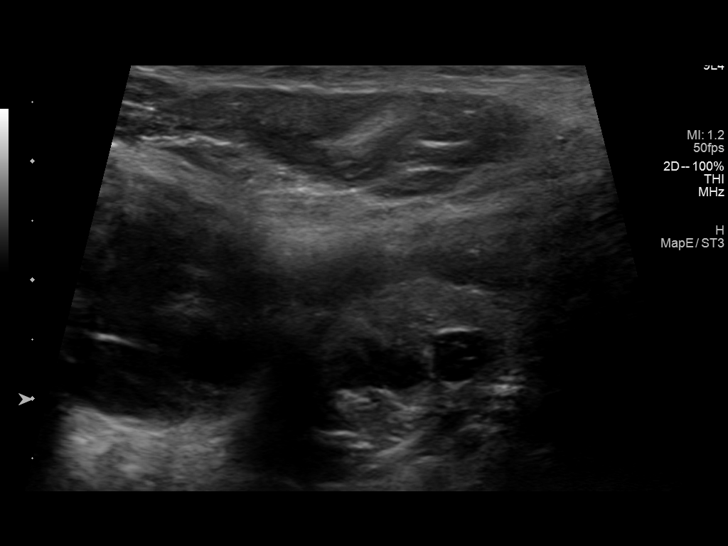
[im 30/51]
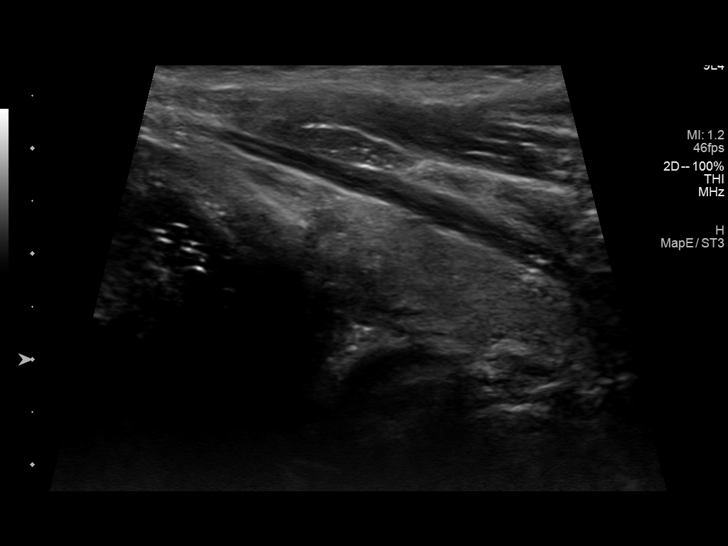
[im 34/51]
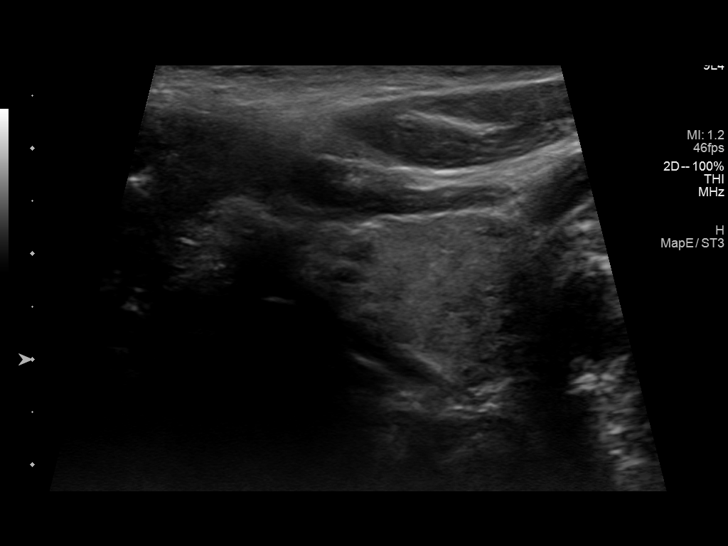
[im 38/51]
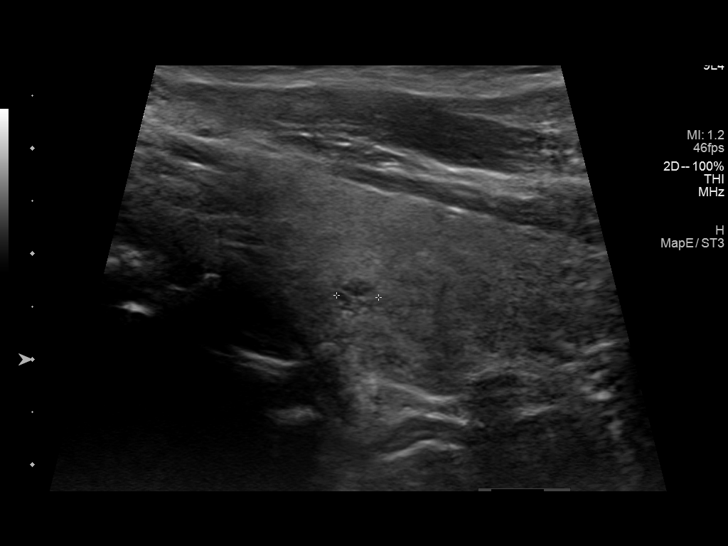
[im 42/51]
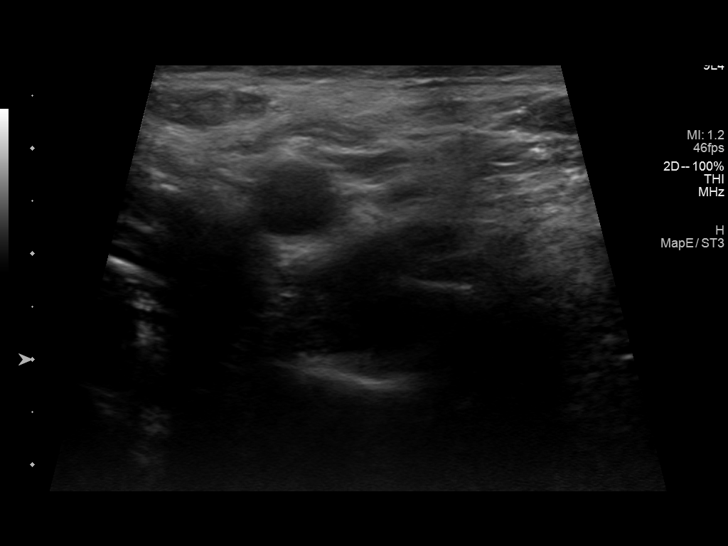
[im 46/51]
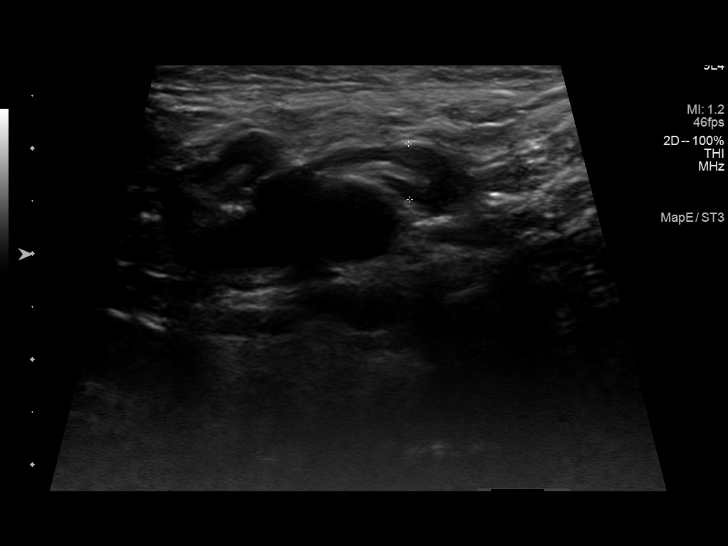
[im 51/51]
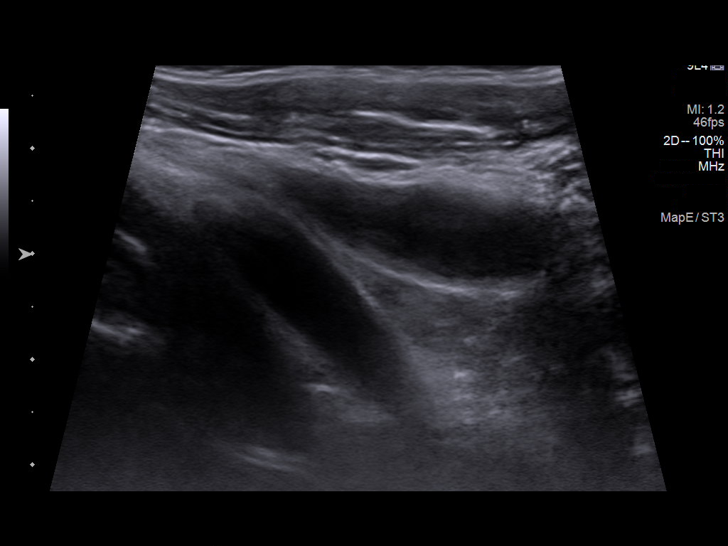

[13 of 25 positions shown; findings below may reference images not displayed]

FINDINGS: Parenchymal Echotexture: Mildly heterogenous

Isthmus: 0.2 cm

Right lobe: 4.6 x 1.9 x 1.9 cm

Left lobe: 3.7 x 1.7 x 1.8 cm

_________________________________________________________

Estimated total number of nodules >/= 1 cm: 1

Number of spongiform nodules >/=  2 cm not described below (TR1): 0

Number of mixed cystic and solid nodules >/= 1.5 cm not described
below (TR2): 0

_________________________________________________________

Nodule # 2:

Prior biopsy: No

Location: Right; inferior

Maximum size: 2.1 cm; Other 2 dimensions: 1.2 x 1.2 cm, previously,
1.9 x 1.1 x 0.9 cm (when measured in a similar manner)

Composition: solid/almost completely solid (2)

Echogenicity: isoechoic (1)

Shape: not taller-than-wide (0)

Margins: smooth (0)

Echogenic foci: none (0)

ACR TI-RADS total points: 3.

ACR TI-RADS risk category:  TR3 (3 points).

Significant change in size (>/= 20% in two dimensions and minimal
increase of 2 mm): No

Change in features: No

Change in ACR TI-RADS risk category: No

ACR TI-RADS recommendations:

*Given size (>/= 1.5 - 2.4 cm) and appearance, a follow-up
ultrasound in 1 year should be considered based on TI-RADS criteria.

_________________________________________________________

Remaining subcentimeter thyroid nodules do not meet criteria for FNA
or imaging follow-up.
IMPRESSION: Nodule 2 (TI-RADS 3), measuring 2.1 cm, located in the inferior
right thyroid lobe is unchanged in size since prior examination from
09/09/2019, and still meets criteria for imaging follow-up. Annual
ultrasound surveillance is recommended until 5 years of stability is
documented.

The above is in keeping with the ACR TI-RADS recommendations - [HOSPITAL] 0984;[DATE].

## 2023-05-04 ENCOUNTER — Other Ambulatory Visit: Payer: Self-pay

## 2023-05-04 ENCOUNTER — Ambulatory Visit (HOSPITAL_COMMUNITY)
Admission: EM | Admit: 2023-05-04 | Discharge: 2023-05-04 | Disposition: A | Discharge status: Home / Self Care | Payer: 59 | Attending: Internal Medicine | Admitting: Internal Medicine

## 2023-05-04 ENCOUNTER — Encounter (HOSPITAL_COMMUNITY): Payer: Self-pay | Admitting: *Deleted

## 2023-05-04 DIAGNOSIS — J4541 Moderate persistent asthma with (acute) exacerbation: Secondary | ICD-10-CM | POA: Diagnosis not present

## 2023-05-04 LAB — POC COVID19/FLU A&B COMBO
Covid Antigen, POC: NEGATIVE
Influenza A Antigen, POC: POSITIVE — AB
Influenza B Antigen, POC: NEGATIVE

## 2023-05-04 MED ORDER — IPRATROPIUM-ALBUTEROL 0.5-2.5 (3) MG/3ML IN SOLN
RESPIRATORY_TRACT | Status: AC
  Filled 2023-05-04: qty 3

## 2023-05-04 MED ORDER — ALBUTEROL SULFATE HFA 108 (90 BASE) MCG/ACT IN AERS: 2.0000 | INHALATION_SPRAY | Freq: Four times a day (QID) | RESPIRATORY_TRACT | 2 refills | Status: AC | PRN

## 2023-05-04 MED ORDER — IPRATROPIUM-ALBUTEROL 0.5-2.5 (3) MG/3ML IN SOLN: 3.0000 mL | Freq: Four times a day (QID) | RESPIRATORY_TRACT | 2 refills | Status: AC | PRN

## 2023-05-04 MED ORDER — IPRATROPIUM-ALBUTEROL 0.5-2.5 (3) MG/3ML IN SOLN
3.0000 mL | Freq: Once | RESPIRATORY_TRACT | Status: AC
  Administered 2023-05-04: 3 mL via RESPIRATORY_TRACT

## 2023-05-04 MED ORDER — TRELEGY ELLIPTA 200-62.5-25 MCG/ACT IN AEPB: 1.0000 | INHALATION_SPRAY | Freq: Every day | RESPIRATORY_TRACT | 2 refills | Status: AC

## 2023-05-04 MED ORDER — METHYLPREDNISOLONE SODIUM SUCC 125 MG IJ SOLR
INTRAMUSCULAR | Status: AC
  Filled 2023-05-04: qty 2

## 2023-05-04 MED ORDER — METHYLPREDNISOLONE SODIUM SUCC 125 MG IJ SOLR
80.0000 mg | Freq: Once | INTRAMUSCULAR | Status: AC
Start: 2023-05-04 — End: 2023-05-04
  Administered 2023-05-04: 80 mg via INTRAMUSCULAR

## 2023-05-04 MED ORDER — AMOXICILLIN-POT CLAVULANATE 875-125 MG PO TABS: 1.0000 | ORAL_TABLET | Freq: Two times a day (BID) | ORAL | 0 refills | Status: AC

## 2023-05-04 NOTE — ED Triage Notes (Signed)
Pt has been using nebulizer at home with out relief.PT SHOB on arrivval to [facility] in triage SPO2 [id number]

## 2023-05-04 NOTE — Discharge Instructions (Signed)
Your rapid influenza antigen test today was positive.  Due to the duration of your symptoms, you would no longer benefit from antiviral therapy for influenza.  Please read below to learn more about the medications, dosages and frequencies that I recommend to help alleviate your symptoms and to get you feeling better soon:   Augmentin (amoxicillin - clavulanic acid):  Please take one (1) dose twice daily for 5 days.  This antibiotic can cause upset stomach, this will resolve once antibiotics are complete.  You are welcome to take a probiotic, eat yogurt, take Imodium while taking this medication.  Please avoid other systemic medications such as Maalox, Pepto-Bismol or milk of magnesia as they can interfere with the body's ability to absorb the antibiotics.    Medrol (methylprednisolone): This is a steroid that will significantly calm your upper and lower airways.  Please take the daily recommended quantity of tablets daily with your breakfast meal starting tomorrow morning until the prescription is complete.      Xyzal (levocetirizine): This is an excellent second-generation antihistamine that helps to reduce respiratory inflammatory response to environmental allergens.  In some patients, this medication can cause daytime sleepiness so I recommend that you take 1 tablet daily at bedtime.    Please continue Singulair 10 mg once daily.   Nasonex (mometasone): This is a steroid nasal spray that used once daily, 1 spray in each nare.  This works best when used on a daily basis. This medication does not work well if it is only used when you think you need it.  After 3 to 5 days of use, you will notice significant reduction of the inflammation and mucus production that is currently being caused by exposure to allergens, whether seasonal or environmental.  The most common side effect of this medication is nosebleeds.  If you experience a nosebleed, please discontinue use for 1 week, then feel free to resume.    If you find that your insurance will not pay for this medication, please consider a different nasal steroids such as Flonase (fluticasone), or Nasacort (triamcinolone).    Trelegy (fluticasone, vilanterol and umeclidinium):  This daily inhaled, MAINTENANCE medication contains an inhaled corticosteroid (fluticasone), a muscarinic bronchodilator (umeclidinium) and a long-acting form of albuterol (vilanterol).  The inhaled steroid and this medication  is not absorbed into the body and will not cause side effects such as increased blood sugar levels, irritability, sleeplessness or weight gain.  Inhaled corticosteroids are sort of like topical steroid creams but, as you can imagine, it is not practical to attempt to rub a steroid cream inside of your lungs.  The long-acting albuterol and muscarinic bronchodilator works similarly to the short acting albuterol and Spiriva (ipratropium) that are found in rescue inhalers.  They provide 24-hour relaxation of the smooth muscles that open and constrict your airways.  Short acting rescue inhalers can only provide this benefit for a few hours.  Please feel free to continue using albuterol (your short acting rescue inhaler) as often as needed throughout the day for shortness of breath, wheezing, and cough.  I have provided you with a coupon to make this medication affordable.   DuoNeb (ipratropium and albuterol): This inhaled medication contains a short acting beta agonist bronchodilator and a muscarinic bronchodilator to be used with her nebulizer machine.  This medication works on the smooth muscle that opens and constricts of your airways by relaxing the muscle.  The result of relaxation of the smooth muscle is increased air movement and improved  work of breathing.  This is a short acting medication that can be used every 4-6 hours as needed for increased work of breathing, shortness of breath, wheezing and excessive coughing.     ProAir, Ventolin, Proventil (albuterol):  This inhaled medication contains a short acting beta agonist bronchodilator to use when you are not at home and able to use your nebulizer machine.  This medication relaxes the smooth muscle of the airway in the lungs.  When these muscles are tight, breathing becomes more constricted.  The result of relaxation of the smooth muscle is increased air movement and improved work of breathing.  This is a short acting medication that can be used every 4-6 hours as needed for increased work of breathing, shortness of breath, wheezing and excessive coughing.  It comes in the form of a handheld inhaler or nebulizer solution.  I recommended that for the next 3 to 4 days, this medication is used 4 times daily on a scheduled basis then decrease to twice daily and as needed until symptoms have completely resolved which I anticipate will be several weeks.   If symptoms have not meaningfully improved in the next 7 to 10 days, please return for repeat evaluation or follow-up with your regular provider.  If symptoms have worsened in the next 3 to 5 days, please go to the emergency room for further evaluation.    Thank you for visiting urgent care today.  We appreciate the opportunity to participate in your care.

## 2023-05-04 NOTE — ED Provider Notes (Signed)
 [facility]    CSN: [id number] Arrival date & time: 05/04/23  1815    HISTORY   Chief Complaint  Patient presents with   Shortness of Breath   HPI Nathaniel Robertson is a pleasant, 63 y.o. male who presents to urgent care today. Quit advair 2 years ago due to change in insurance  The history is provided by the patient.   Past Medical History:  Diagnosis Date   CKD (chronic kidney disease)    stage 3   Diabetes mellitus without complication (HCC)    Essential hypertension    Hypertension    Impaired fasting glucose    Mild persistent asthma without complication    Mixed hyperlipidemia    Obstructive sleep apnea    lost weight, no CPAP now   Rash    Snoring 12/31/2014   Patient Active Problem List   Diagnosis Date Noted   Blurry vision 11/23/2021   Seasonal and perennial allergic rhinoconjunctivitis 06/18/2020   Asthma 05/21/2020   Pruritus 05/21/2020   HTN, goal below 130/80 11/12/2017   Severe obstructive sleep apnea 11/12/2017   Snoring 12/31/2014   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 Authorizing Provider  albuterol (VENTOLIN HFA) 108 (90 Base) MCG/ACT inhaler Inhale 2 puffs into the lungs every 6 (six) hours as needed for wheezing or shortness of breath (Cough). 05/04/23  Yes Theadora Rama Scales, PA-C  amLODipine (NORVASC) 10 MG tablet TK 1 T PO QD 01/08/18  Yes [provider]  amoxicillin-clavulanate (AUGMENTIN) 875-125 MG tablet Take 1 tablet by mouth 2 (two) times daily for 5 days. 05/04/23 05/09/23 Yes Theadora Rama Scales, PA-C  atorvastatin (LIPITOR) 10 MG tablet TK 1 T PO  D. GEF LIPITOR 01/08/18  Yes [provider]  Fluticasone-Umeclidin-Vilant (TRELEGY ELLIPTA) 200-62.5-25 MCG/ACT AEPB Inhale 1 Inhalation into the lungs daily. 05/04/23 08/02/23 Yes Theadora Rama Scales, PA-C  hydrOXYzine (ATARAX) 25 MG tablet Take 1 tablet (25 mg total) by mouth at bedtime as needed for itching. 02/18/21  Yes Ellamae Sia, DO  ipratropium-albuterol (DUONEB) 0.5-2.5 (3) MG/3ML SOLN Take 3 mLs by nebulization 4 (four) times daily as needed. 05/04/23 08/02/23 Yes Theadora Rama Scales, PA-C  levocetirizine (XYZAL) 5 MG tablet Take 5 mg by mouth every evening.   Yes [provider]  losartan (COZAAR) 100 MG tablet Take 100 mg by mouth daily. 04/28/15  Yes [provider]  metFORMIN (GLUCOPHAGE-XR) 500 MG 24 hr tablet Take 500 mg by mouth at bedtime.  04/28/15  Yes [provider]  montelukast (SINGULAIR) 10 MG tablet TAKE 1 TABLET BY MOUTH AT BEDTIME 04/22/22  Yes Ellamae Sia, DO  Omega 3-6-9 Fatty Acids (OMEGA 3-6-9 COMPLEX PO) Take by mouth.   Yes [provider]  Cholecalciferol (VITAMIN D-1000 MAX ST PO) Take by mouth.    [provider]  EPINEPHrine 0.3 mg/0.3 mL IJ SOAJ injection Use as directed  for life-threatening allergic reaction. 07/07/20   Ellamae Sia, DO    Family History Family History  Problem Relation Age of Onset   Diabetes Mother    Asthma Father    Social History Social History   Tobacco Use   Smoking status: Never   Smokeless tobacco: Never  Vaping Use   Vaping status: Never Used  Substance Use Topics   Alcohol use: Yes    Comment: occasional   Drug use: No   Allergies   Patient has no known allergies.  Review of Systems Review of Systems Pertinent findings revealed after performing  a 14 point review of systems has been noted in the history of present illness.  Physical Exam Vital Signs BP (!) 116/105   Pulse (!) 107   Temp 97.9 F (36.6 C) (Temporal)   Resp (!) 24   SpO2 97%   No data found.  Physical Exam  Visual Acuity Right Eye Distance:   Left Eye Distance:   Bilateral Distance:    Right Eye Near:   Left Eye Near:    Bilateral Near:     UC Couse / Diagnostics / Procedures:     Radiology No results found.  Procedures Procedures (including critical care time) EKG  Pending results:  Labs Reviewed  POC COVID19/FLU A&B COMBO - Abnormal; Notable for the following components:      Result Value   Influenza A Antigen, POC Positive (*)    All other components within normal limits    Medications Ordered in UC: Medications  methylPREDNISolone sodium succinate (SOLU-MEDROL) 125 mg/2 mL injection 80 mg (80 mg Intramuscular Given 05/04/23 1832)  ipratropium-albuterol (DUONEB) 0.5-2.5 (3) MG/3ML nebulizer solution 3 mL (3 mLs Nebulization Given 05/04/23 1937)  ipratropium-albuterol (DUONEB) 0.5-2.5 (3) MG/3ML nebulizer solution 3 mL (3 mLs Nebulization Given 05/04/23 1955)    UC Diagnoses / Final Clinical Impressions(s)   I have reviewed the triage vital signs and the nursing notes.  Pertinent labs & imaging results that were available during my care of the patient were reviewed by me and considered in my medical decision making (see chart for details).    Final diagnoses:  Moderate persistent asthma with (acute) exacerbation   ***  Please see discharge instructions below for details of plan of care as provided to patient. ED Prescriptions     Medication Sig Dispense Auth. Provider   Fluticasone-Umeclidin-Vilant (TRELEGY ELLIPTA) 200-62.5-25 MCG/ACT AEPB Inhale 1 Inhalation into the lungs daily. 1 each Theadora Rama Scales, PA-C   ipratropium-albuterol (DUONEB) 0.5-2.5 (3) MG/3ML SOLN Take 3 mLs by nebulization 4 (four) times daily as needed.  360 mL Theadora Rama Scales, PA-C   albuterol (VENTOLIN HFA) 108 (90 Base) MCG/ACT inhaler Inhale 2 puffs into the lungs every 6 (six) hours as needed for wheezing or shortness of breath (Cough). 18 g Theadora Rama Scales, PA-C   amoxicillin-clavulanate (AUGMENTIN) 875-125 MG tablet Take 1 tablet by mouth 2 (two) times daily for 5 days. 10 tablet Theadora Rama Scales, PA-C      PDMP not reviewed this encounter.  Pending results:  Labs Reviewed  POC COVID19/FLU A&B COMBO - Abnormal; Notable for the following components:      Result Value   Influenza A Antigen, POC Positive (*)    All other components within normal limits      Discharge Instructions      Your rapid influenza antigen test today was positive.  Due to the duration of your symptoms, you would no longer benefit  from antiviral therapy for influenza.  Please read below to learn more about the medications, dosages and frequencies that I recommend to help alleviate your symptoms and to get you feeling better soon:   Augmentin (amoxicillin - clavulanic acid):  Please take one (1) dose twice daily for 5 days.  This antibiotic can cause upset stomach, this will resolve once antibiotics are complete.  You are welcome to take a probiotic, eat yogurt, take Imodium while taking this medication.  Please avoid other systemic medications such as Maalox, Pepto-Bismol or milk of magnesia as they can interfere with the body's ability to absorb the antibiotics.    Medrol (methylprednisolone): This is a steroid that will significantly calm your upper and lower airways.  Please take the daily recommended quantity of tablets daily with your breakfast meal starting tomorrow morning until the prescription is complete.      Xyzal (levocetirizine): This is an excellent second-generation antihistamine that helps to reduce respiratory inflammatory response to environmental allergens.  In some patients, this medication can cause daytime sleepiness  so I recommend that you take 1 tablet daily at bedtime.    Please continue Singulair 10 mg once daily.   Nasonex (mometasone): This is a steroid nasal spray that used once daily, 1 spray in each nare.  This works best when used on a daily basis. This medication does not work well if it is only used when you think you need it.  After 3 to 5 days of use, you will notice significant reduction of the inflammation and mucus production that is currently being caused by exposure to allergens, whether seasonal or environmental.  The most common side effect of this medication is nosebleeds.  If you experience a nosebleed, please discontinue use for 1 week, then feel free to resume.   If you find that your insurance will not pay for this medication, please consider a different nasal steroids such as Flonase (fluticasone), or Nasacort (triamcinolone).    Trelegy (fluticasone, vilanterol and umeclidinium):  This daily inhaled, MAINTENANCE medication contains an inhaled corticosteroid (fluticasone), a muscarinic bronchodilator (umeclidinium) and a long-acting form of albuterol (vilanterol).  The inhaled steroid and this medication  is not absorbed into the body and will not cause side effects such as increased blood sugar levels, irritability, sleeplessness or weight gain.  Inhaled corticosteroids are sort of like topical steroid creams but, as you can imagine, it is not practical to attempt to rub a steroid cream inside of your lungs.  The long-acting albuterol and muscarinic bronchodilator works similarly to the short acting albuterol and Spiriva (ipratropium) that are found in rescue inhalers.  They provide 24-hour relaxation of the smooth muscles that open and constrict your airways.  Short acting rescue inhalers can only provide this benefit for a few hours.  Please feel free to continue using albuterol (your short acting rescue inhaler) as often as needed throughout the day for shortness of breath, wheezing, and  cough.  I have provided you with a coupon to make this medication affordable.   DuoNeb (ipratropium and albuterol): This inhaled medication contains a short acting beta agonist bronchodilator and a muscarinic bronchodilator to be used with her nebulizer machine.  This medication works on the smooth muscle that opens and constricts of your airways by relaxing the muscle.  The result of relaxation of the smooth muscle is increased air movement and improved work of breathing.  This is a short acting medication that can be used every 4-6 hours as needed for  increased work of breathing, shortness of breath, wheezing and excessive coughing.     ProAir, Ventolin, Proventil (albuterol): This inhaled medication contains a short acting beta agonist bronchodilator to use when you are not at home and able to use your nebulizer machine.  This medication relaxes the smooth muscle of the airway in the lungs.  When these muscles are tight, breathing becomes more constricted.  The result of relaxation of the smooth muscle is increased air movement and improved work of breathing.  This is a short acting medication that can be used every 4-6 hours as needed for increased work of breathing, shortness of breath, wheezing and excessive coughing.  It comes in the form of a handheld inhaler or nebulizer solution.  I recommended that for the next 3 to 4 days, this medication is used 4 times daily on a scheduled basis then decrease to twice daily and as needed until symptoms have completely resolved which I anticipate will be several weeks.   If symptoms have not meaningfully improved in the next 7 to 10 days, please return for repeat evaluation or follow-up with your regular provider.  If symptoms have worsened in the next 3 to 5 days, please go to the emergency room for further evaluation.    Thank you for visiting urgent care today.  We appreciate the opportunity to participate in your care.     Disposition Upon Discharge:   Condition: stable for discharge home  Patient presented with an acute illness with associated systemic symptoms and significant discomfort requiring urgent management. In my opinion, this is a condition that a prudent lay person (someone who possesses an average knowledge of health and medicine) may potentially expect to result in complications if not addressed urgently such as respiratory distress, impairment of bodily function or dysfunction of bodily organs.   Routine symptom specific, illness specific and/or disease specific instructions were discussed with the patient and/or caregiver at length.   As such, the patient has been evaluated and assessed, work-up was performed and treatment was provided in alignment with urgent care protocols and evidence based medicine.  Patient/parent/caregiver has been advised that the patient may require follow up for further testing and treatment if the symptoms continue in spite of treatment, as clinically indicated and appropriate.  Patient/parent/caregiver has been advised to return to the [facility] or PCP if no better; to PCP or the Emergency Department if new signs and symptoms develop, or if the current signs or symptoms continue to change or worsen for further workup, evaluation and treatment as clinically indicated and appropriate  The patient will follow up with their current PCP if and as advised. If the patient does not currently have a PCP we will assist them in obtaining one.   The patient may need specialty follow up if the symptoms continue, in spite of conservative treatment and management, for further workup, evaluation, consultation and treatment as clinically indicated and appropriate.  Patient/parent/caregiver verbalized understanding and agreement of plan as discussed.  All questions were addressed during visit.  Please see discharge instructions below for further details of plan.  This office note has been dictated using Engineer, structural.  Unfortunately, this method of dictation can sometimes lead to typographical or grammatical errors.  I apologize for your inconvenience in advance if this occurs.  Please do not hesitate to reach out to me if clarification is needed.

## 2023-05-24 DIAGNOSIS — K760 Fatty (change of) liver, not elsewhere classified: Secondary | ICD-10-CM | POA: Diagnosis not present

## 2023-05-24 DIAGNOSIS — E782 Mixed hyperlipidemia: Secondary | ICD-10-CM | POA: Diagnosis not present

## 2023-05-24 DIAGNOSIS — I10 Essential (primary) hypertension: Secondary | ICD-10-CM | POA: Diagnosis not present

## 2023-05-24 DIAGNOSIS — R7303 Prediabetes: Secondary | ICD-10-CM | POA: Diagnosis not present

## 2023-05-31 DIAGNOSIS — L209 Atopic dermatitis, unspecified: Secondary | ICD-10-CM | POA: Diagnosis not present

## 2023-05-31 DIAGNOSIS — I10 Essential (primary) hypertension: Secondary | ICD-10-CM | POA: Diagnosis not present

## 2023-05-31 DIAGNOSIS — J4530 Mild persistent asthma, uncomplicated: Secondary | ICD-10-CM | POA: Diagnosis not present

## 2023-05-31 DIAGNOSIS — R7303 Prediabetes: Secondary | ICD-10-CM | POA: Diagnosis not present

## 2023-05-31 DIAGNOSIS — E782 Mixed hyperlipidemia: Secondary | ICD-10-CM | POA: Diagnosis not present

## 2023-05-31 DIAGNOSIS — E041 Nontoxic single thyroid nodule: Secondary | ICD-10-CM | POA: Diagnosis not present

## 2023-05-31 DIAGNOSIS — K760 Fatty (change of) liver, not elsewhere classified: Secondary | ICD-10-CM | POA: Diagnosis not present

## 2023-05-31 DIAGNOSIS — G4733 Obstructive sleep apnea (adult) (pediatric): Secondary | ICD-10-CM | POA: Diagnosis not present

## 2023-05-31 DIAGNOSIS — R931 Abnormal findings on diagnostic imaging of heart and coronary circulation: Secondary | ICD-10-CM | POA: Diagnosis not present

## 2023-07-12 ENCOUNTER — Other Ambulatory Visit: Payer: Self-pay | Admitting: Internal Medicine

## 2023-07-12 DIAGNOSIS — E041 Nontoxic single thyroid nodule: Secondary | ICD-10-CM

## 2023-07-27 ENCOUNTER — Ambulatory Visit
Admission: RE | Admit: 2023-07-27 | Discharge: 2023-07-27 | Disposition: A | Discharge status: Home / Self Care | Source: Ambulatory Visit | Attending: Internal Medicine | Admitting: Internal Medicine

## 2023-07-27 DIAGNOSIS — E041 Nontoxic single thyroid nodule: Secondary | ICD-10-CM

## 2023-09-12 DIAGNOSIS — I10 Essential (primary) hypertension: Secondary | ICD-10-CM | POA: Diagnosis not present

## 2023-09-12 DIAGNOSIS — L209 Atopic dermatitis, unspecified: Secondary | ICD-10-CM | POA: Diagnosis not present

## 2023-09-12 DIAGNOSIS — E041 Nontoxic single thyroid nodule: Secondary | ICD-10-CM | POA: Diagnosis not present

## 2023-09-12 DIAGNOSIS — Z0000 Encounter for general adult medical examination without abnormal findings: Secondary | ICD-10-CM | POA: Diagnosis not present

## 2023-09-12 DIAGNOSIS — R7303 Prediabetes: Secondary | ICD-10-CM | POA: Diagnosis not present

## 2023-09-12 DIAGNOSIS — K760 Fatty (change of) liver, not elsewhere classified: Secondary | ICD-10-CM | POA: Diagnosis not present

## 2023-09-12 DIAGNOSIS — R931 Abnormal findings on diagnostic imaging of heart and coronary circulation: Secondary | ICD-10-CM | POA: Diagnosis not present

## 2023-09-12 DIAGNOSIS — E782 Mixed hyperlipidemia: Secondary | ICD-10-CM | POA: Diagnosis not present

## 2023-09-12 DIAGNOSIS — G4733 Obstructive sleep apnea (adult) (pediatric): Secondary | ICD-10-CM | POA: Diagnosis not present

## 2023-09-12 DIAGNOSIS — J4530 Mild persistent asthma, uncomplicated: Secondary | ICD-10-CM | POA: Diagnosis not present

## 2023-11-23 DIAGNOSIS — R7303 Prediabetes: Secondary | ICD-10-CM | POA: Diagnosis not present
